# Patient Record
Sex: Female | Born: 1960 | Race: White | Hispanic: No | Marital: Married | Smoking: Current every day smoker
Health system: Southern US, Community
[De-identification: ages and names within clinical notes are randomized; demographics above are authoritative.]

## PROBLEM LIST (undated history)

## (undated) DIAGNOSIS — M2550 Pain in unspecified joint: Secondary | ICD-10-CM

## (undated) DIAGNOSIS — M797 Fibromyalgia: Secondary | ICD-10-CM

## (undated) DIAGNOSIS — R110 Nausea: Secondary | ICD-10-CM

## (undated) DIAGNOSIS — Z8669 Personal history of other diseases of the nervous system and sense organs: Secondary | ICD-10-CM

## (undated) DIAGNOSIS — I10 Essential (primary) hypertension: Secondary | ICD-10-CM

## (undated) DIAGNOSIS — R233 Spontaneous ecchymoses: Secondary | ICD-10-CM

## (undated) DIAGNOSIS — Z860100 Personal history of colon polyps, unspecified: Secondary | ICD-10-CM

## (undated) DIAGNOSIS — K5900 Constipation, unspecified: Secondary | ICD-10-CM

## (undated) DIAGNOSIS — M1990 Unspecified osteoarthritis, unspecified site: Secondary | ICD-10-CM

## (undated) DIAGNOSIS — R0602 Shortness of breath: Secondary | ICD-10-CM

## (undated) DIAGNOSIS — R531 Weakness: Secondary | ICD-10-CM

## (undated) DIAGNOSIS — F32A Depression, unspecified: Secondary | ICD-10-CM

## (undated) DIAGNOSIS — G8929 Other chronic pain: Secondary | ICD-10-CM

## (undated) DIAGNOSIS — M2540 Effusion, unspecified joint: Secondary | ICD-10-CM

## (undated) DIAGNOSIS — F329 Major depressive disorder, single episode, unspecified: Secondary | ICD-10-CM

## (undated) DIAGNOSIS — R238 Other skin changes: Secondary | ICD-10-CM

## (undated) DIAGNOSIS — J189 Pneumonia, unspecified organism: Secondary | ICD-10-CM

## (undated) DIAGNOSIS — I209 Angina pectoris, unspecified: Secondary | ICD-10-CM

## (undated) DIAGNOSIS — Z87440 Personal history of urinary (tract) infections: Secondary | ICD-10-CM

## (undated) DIAGNOSIS — J449 Chronic obstructive pulmonary disease, unspecified: Secondary | ICD-10-CM

## (undated) DIAGNOSIS — R200 Anesthesia of skin: Secondary | ICD-10-CM

## (undated) DIAGNOSIS — G930 Cerebral cysts: Secondary | ICD-10-CM

## (undated) DIAGNOSIS — M549 Dorsalgia, unspecified: Secondary | ICD-10-CM

## (undated) DIAGNOSIS — E039 Hypothyroidism, unspecified: Secondary | ICD-10-CM

## (undated) DIAGNOSIS — E785 Hyperlipidemia, unspecified: Secondary | ICD-10-CM

## (undated) DIAGNOSIS — K219 Gastro-esophageal reflux disease without esophagitis: Secondary | ICD-10-CM

## (undated) DIAGNOSIS — G4700 Insomnia, unspecified: Secondary | ICD-10-CM

## (undated) DIAGNOSIS — R202 Paresthesia of skin: Secondary | ICD-10-CM

## (undated) DIAGNOSIS — Z8709 Personal history of other diseases of the respiratory system: Secondary | ICD-10-CM

## (undated) HISTORY — PX: SHOULDER SURGERY: SHX246

## (undated) HISTORY — PX: COLONOSCOPY: SHX174

## (undated) HISTORY — PX: TUBAL LIGATION: SHX77

## (undated) HISTORY — PX: CARPAL TUNNEL RELEASE: SHX101

## (undated) HISTORY — PX: TONSILLECTOMY: SUR1361

## (undated) HISTORY — PX: ESOPHAGOGASTRODUODENOSCOPY: SHX1529

## (undated) HISTORY — PX: BREAST SURGERY: SHX581

## (undated) HISTORY — PX: CHOLECYSTECTOMY: SHX55

---

## 1994-08-02 HISTORY — PX: ABDOMINAL HYSTERECTOMY: SHX81

## 2008-05-21 ENCOUNTER — Ambulatory Visit (HOSPITAL_BASED_OUTPATIENT_CLINIC_OR_DEPARTMENT_OTHER): Admission: RE | Admit: 2008-05-21 | Discharge: 2008-05-21 | Payer: Self-pay | Admitting: Orthopaedic Surgery

## 2010-03-20 ENCOUNTER — Ambulatory Visit (HOSPITAL_COMMUNITY): Admission: RE | Admit: 2010-03-20 | Discharge: 2010-03-20 | Payer: Self-pay | Admitting: Cardiovascular Disease

## 2010-08-02 HISTORY — PX: CARDIAC CATHETERIZATION: SHX172

## 2010-10-16 LAB — CBC
HCT: 39.3 % (ref 36.0–46.0)
MCH: 32.3 pg (ref 26.0–34.0)
MCHC: 35.4 g/dL (ref 30.0–36.0)
MCV: 91.2 fL (ref 78.0–100.0)
Platelets: 265 10*3/uL (ref 150–400)
RDW: 13.7 % (ref 11.5–15.5)
WBC: 9.1 10*3/uL (ref 4.0–10.5)

## 2010-10-16 LAB — URINALYSIS, ROUTINE W REFLEX MICROSCOPIC
Bilirubin Urine: NEGATIVE
Glucose, UA: NEGATIVE mg/dL
Hgb urine dipstick: NEGATIVE
Ketones, ur: NEGATIVE mg/dL
Specific Gravity, Urine: 1.017 (ref 1.005–1.030)
Urobilinogen, UA: 1 mg/dL (ref 0.0–1.0)

## 2010-10-16 LAB — PROTIME-INR
INR: 0.89 (ref 0.00–1.49)
Prothrombin Time: 12.3 seconds (ref 11.6–15.2)

## 2010-10-16 LAB — BASIC METABOLIC PANEL
CO2: 27 mEq/L (ref 19–32)
GFR calc non Af Amer: >60 mL/min (ref >60)

## 2010-12-15 NOTE — Op Note (Signed)
Savannah Gardner, Savannah Gardner            ACCOUNT NO.:  [id number]   MEDICAL RECORD NO.:  [id number]          PATIENT TYPE:  AMB   LOCATION:  DSC                          FACILITY:  [facility]   PHYSICIAN:  Lubertha Basque. Dalldorf, M.D.DATE OF BIRTH:  09/19/1960   DATE OF PROCEDURE:  05/21/2008  DATE OF DISCHARGE:                               OPERATIVE REPORT   PREOPERATIVE DIAGNOSES:  1. Right shoulder biceps tendinopathy.  2. Right wrist de Quervain tenosynovitis.   POSTOPERATIVE DIAGNOSES:  1. Right shoulder biceps tendinopathy.  2. Right wrist de Quervain tenosynovitis.   PROCEDURE:  1. Right shoulder arthroscopic debridement and tenotomy.  2. Right shoulder open biceps tenodesis.  3. Right wrist de Quervain's release.   ANESTHESIA:  General and block.   ATTENDING SURGEON:  Lubertha Basque. Jerl Santos, MD   ASSISTANT:  Lindwood Qua, PA   INDICATIONS FOR PROCEDURE:  The patient is a 50 year old woman with a  long history of right shoulder difficulty.  She is many years out from  arthroscopy elsewhere of the shoulder where an acromioplasty and AC  resection were done.  She has persisted with anterior pain despite  several injections.  By an old MRI scan, she has some biceps  tendinopathy.  Her pain persists in the bicipital groove and limits her  ability to rest and use her arm.  At this point, she is offered a repeat  arthroscopy with probable open biceps tenodesis.  She also has had  several injections about her first dorsal compartment of the right wrist  consistent with de Quervain's.  She received transient relief with each  and at this point, she is offered a first dorsal compartment release  under the same sitting.  Informed operative consent was obtained after  discussion of possible complications including reaction to anesthesia,  infection, and neurovascular injury.   SUMMARY, FINDINGS, AND PROCEDURE:  Under general anesthesia and a block,  a right shoulder arthroscopy was initially  performed.  The glenohumeral  joint showed no degenerative changes and the rotator cuff appeared  benign from below.  She did have a type 1 SLAP addressed with a  debridement.  Her biceps tendon appeared quite degenerative and swollen  at the attachment site on the superior labrum.  There was also a type 2  component to this SLAP labral issue and I performed a debridement under  the labrum to bleeding bone.  I then performed a tenotomy with basket  and shaver.  The shoulder was irrigated followed by removal of  arthroscopic equipment.  We then made a small anterolateral incision  with dissection down through a split in the deltoid to the bicipital  groove.  I performed a tenodesis of the tendon in this area to bleeding  bed of bone with some sutures.  We then made a small incision on the  dorsoradial aspect of the wrist and performed a first dorsal compartment  release visualizing all 3 tendons and the nerve which was retracted out  of harm's way.   DESCRIPTION OF PROCEDURE:  The patient was taken to the operating suite  where general anesthetic was applied without  difficulty.  She was also  given a block in the Preanesthesia Area.  She was positioned in beach-  chair position and prepped and draped in the normal sterile fashion.  After administration of IV Kefzol, an arthroscopy of the right shoulder  was performed through total of 2 portals.  Findings were as noted above.  Procedure consisted of the debridement of the degenerative tendon and  the superior aspect of the glenoid under her slap.  The tenotomy was  done with basket and shaver.  I then made the anterolateral incision  with dissection down to the bicipital groove.  The tendon was pulled  down into this area.  I created a bleeding bed of bone in the bicipital  groove and then sutured the tendon with #1 Vicryl to stay directly in  this area.  We sutured the tendon to surrounding thick tissues.  I cut  the excess tendon sharply  with a knife.  This wound was irrigated  followed by reapproximation of the deltoid split with 0 Vicryl.  Subcutaneous tissues were reapproximated with 2-0 undyed Vicryl and skin  was closed with nylon.  We also reapproximated the arthroscopic portals  with simple sutures of nylon.  We then made a small dorsoradial incision  just proximal to the radial styloid at the same side of the wrist.  Dissection was carried down to the first dorsal compartment.  I  retracted the radial sensory nerve out of harm's way and this was well  visualized through the course of the incision.  I released the first  dorsal compartment and saw 3 slips of tendon.  I did not seem to be  under terrible amount of pressure.  The wound was irrigated followed by  reapproximation of the skin with nylon.  Adaptic was applied here  followed by dry gauze and loose Ace wrap.  To the shoulder, we applied  Adaptic and dry gauze with tape.  Estimated blood loss and  intraoperative fluids could be obtained from anesthesia records.   DISPOSITION:  The patient was extubated in the operating room and taken  to the recovery room in stable addition.  She was to go home the same-  day and follow up in the office closely.  I will contact her by phone  tonight.  Lindwood Qua assisted throughout this case, especially the  biceps tenodesis portion and was invaluable to its completion in that he  helped maintain position and retract while I performed the procedure.      Lubertha Basque Jerl Santos, M.D.  Electronically Signed     PGD/MEDQ  D:  05/21/2008  T:  05/21/2008  Job:  [id number]

## 2011-05-04 LAB — BASIC METABOLIC PANEL
BUN: 8
Calcium: 9.4
Chloride: 105
GFR calc Af Amer: >60
GFR calc non Af Amer: >60
Glucose, Bld: 97
Potassium: 4.8
Sodium: 139

## 2011-05-04 LAB — POCT HEMOGLOBIN-HEMACUE: Hemoglobin: 14.9

## 2011-07-02 ENCOUNTER — Encounter (HOSPITAL_COMMUNITY): Payer: Self-pay | Admitting: Pharmacy Technician

## 2011-07-09 ENCOUNTER — Other Ambulatory Visit: Payer: Self-pay | Admitting: Cardiovascular Disease

## 2011-07-16 ENCOUNTER — Other Ambulatory Visit: Payer: Self-pay

## 2011-07-16 ENCOUNTER — Encounter (HOSPITAL_COMMUNITY): Payer: Self-pay | Admitting: Physician Assistant

## 2011-07-16 ENCOUNTER — Ambulatory Visit (HOSPITAL_COMMUNITY)
Admission: RE | Admit: 2011-07-16 | Discharge: 2011-07-16 | Disposition: A | Discharge status: Home / Self Care | Payer: Medicare Other | Source: Ambulatory Visit | Attending: Cardiovascular Disease | Admitting: Cardiovascular Disease

## 2011-07-16 ENCOUNTER — Encounter (HOSPITAL_COMMUNITY)
Admission: RE | Disposition: A | Discharge status: Home / Self Care | Payer: Self-pay | Source: Ambulatory Visit | Attending: Cardiovascular Disease

## 2011-07-16 DIAGNOSIS — M797 Fibromyalgia: Secondary | ICD-10-CM

## 2011-07-16 DIAGNOSIS — F17200 Nicotine dependence, unspecified, uncomplicated: Secondary | ICD-10-CM | POA: Insufficient documentation

## 2011-07-16 DIAGNOSIS — E785 Hyperlipidemia, unspecified: Secondary | ICD-10-CM

## 2011-07-16 DIAGNOSIS — I200 Unstable angina: Secondary | ICD-10-CM

## 2011-07-16 DIAGNOSIS — Z720 Tobacco use: Secondary | ICD-10-CM

## 2011-07-16 DIAGNOSIS — R079 Chest pain, unspecified: Secondary | ICD-10-CM | POA: Insufficient documentation

## 2011-07-16 DIAGNOSIS — I10 Essential (primary) hypertension: Secondary | ICD-10-CM

## 2011-07-16 HISTORY — DX: Hyperlipidemia, unspecified: E78.5

## 2011-07-16 HISTORY — DX: Fibromyalgia: M79.7

## 2011-07-16 HISTORY — DX: Essential (primary) hypertension: I10

## 2011-07-16 HISTORY — DX: Chronic obstructive pulmonary disease, unspecified: J44.9

## 2011-07-16 HISTORY — PX: LEFT HEART CATHETERIZATION WITH CORONARY ANGIOGRAM: SHX5451

## 2011-07-16 LAB — CBC
HCT: 39 % (ref 36.0–46.0)
MCH: 31.6 pg (ref 26.0–34.0)
MCV: 92 fL (ref 78.0–100.0)
Platelets: 270 10*3/uL (ref 150–400)
RBC: 4.24 MIL/uL (ref 3.87–5.11)
WBC: 9.2 10*3/uL (ref 4.0–10.5)

## 2011-07-16 LAB — BASIC METABOLIC PANEL
CO2: 26 mEq/L (ref 19–32)
Calcium: 9.1 mg/dL (ref 8.4–10.5)
Chloride: 106 mEq/L (ref 96–112)
Creatinine, Ser: 0.77 mg/dL (ref 0.50–1.10)
Glucose, Bld: 113 mg/dL — ABNORMAL HIGH (ref 70–99)
Sodium: 139 mEq/L (ref 135–145)

## 2011-07-16 SURGERY — LEFT HEART CATHETERIZATION WITH CORONARY ANGIOGRAM
Anesthesia: LOCAL

## 2011-07-16 MED ORDER — ONDANSETRON HCL 4 MG/2ML IJ SOLN: 4.0000 mg | Freq: Four times a day (QID) | INTRAMUSCULAR | Status: DC | PRN

## 2011-07-16 MED ORDER — SODIUM CHLORIDE 0.9 % IV SOLN: INTRAVENOUS | Status: DC

## 2011-07-16 MED ORDER — MIDAZOLAM HCL 2 MG/2ML IJ SOLN
INTRAMUSCULAR | Status: AC
  Filled 2011-07-16: qty 2

## 2011-07-16 MED ORDER — SODIUM CHLORIDE 0.9 % IV SOLN
INTRAVENOUS | Status: DC
  Administered 2011-07-16: 11:00:00 via INTRAVENOUS

## 2011-07-16 MED ORDER — FENTANYL CITRATE 0.05 MG/ML IJ SOLN
INTRAMUSCULAR | Status: AC
  Filled 2011-07-16: qty 2

## 2011-07-16 MED ORDER — SODIUM CHLORIDE 0.9 % IJ SOLN: 3.0000 mL | INTRAMUSCULAR | Status: DC | PRN

## 2011-07-16 MED ORDER — ACETAMINOPHEN 325 MG PO TABS: 650.0000 mg | ORAL_TABLET | ORAL | Status: DC | PRN

## 2011-07-16 MED ORDER — HEPARIN (PORCINE) IN NACL 2-0.9 UNIT/ML-% IJ SOLN
INTRAMUSCULAR | Status: AC
  Filled 2011-07-16: qty 2000

## 2011-07-16 MED ORDER — DIAZEPAM 5 MG PO TABS
5.0000 mg | ORAL_TABLET | ORAL | Status: AC
  Administered 2011-07-16: 5 mg via ORAL
  Filled 2011-07-16: qty 1

## 2011-07-16 MED ORDER — ASPIRIN 81 MG PO CHEW
CHEWABLE_TABLET | ORAL | Status: AC
  Filled 2011-07-16: qty 4

## 2011-07-16 MED ORDER — LIDOCAINE HCL (PF) 1 % IJ SOLN
INTRAMUSCULAR | Status: AC
  Filled 2011-07-16: qty 30

## 2011-07-16 MED ORDER — NITROGLYCERIN 0.2 MG/ML ON CALL CATH LAB
INTRAVENOUS | Status: AC
  Filled 2011-07-16: qty 1

## 2011-07-16 NOTE — Progress Notes (Signed)
IV team unable to come for second IV site, Cath lab notified Selena Batten

## 2011-07-16 NOTE — H&P (Signed)
  H & P will be scanned in.  Pt was reexamined and existing H & P reviewed. No changes found.  Runell Gess, MD [facility] 07/16/2011 12:04 PM

## 2011-07-16 NOTE — H&P (Signed)
Savannah Gardner is an 50 y.o. female.   Chief Complaint: Chest Pain  HPI: patient is a 50 year old obese Caucasian female who is a patient of Dr. Allyson Sabal and Roxanne Mins.  She has a history of hypertension, hyperlipidemia, tobacco abuse, fibromyalgia, COPD. She had a left heart catheterization in August of 2011 which showed a normal  coronary anatomy.  The overall LVEF was estimated at greater than 60% without focal wall motion abnormalities.  Patient had a recent normal nuclear stress test on 06/22/2011.  Patient was recently treated with antibiotics for bronchitis.  She continues to have chest pain for the last year which radiates to her jaw with diaphoresis, shortness of breath, palpitations, nausea and weakness, two-pillow orthopnea and paroxysmal nocturnal dyspnea.  Complains of occasional lower extremity edema on the right.  She also reports lower extremity pain with ambulation and relieved with rest. She presents for left heart catheterization.  Past Medical History  Diagnosis Date  . Hypertension   . Hyperlipemia   . Fibromyalgia   . COPD (chronic obstructive pulmonary disease)     Past Surgical History  Procedure Date  . Tonsillectomy   . Breast surgery   . Cholecystectomy   . Abdominal hysterectomy     Family History  Problem Relation Age of Onset  . Heart failure Mother    Social History:  reports that she has been smoking.  She does not have any smokeless tobacco history on file. She reports that she does not drink alcohol or use illicit drugs.  Allergies:  Allergies  Allergen Reactions  . Sulfa Antibiotics Other (See Comments)    Threw up  . Penicillins Rash    Medications Prior to Admission  Medication Dose Route Frequency Provider Last Rate Last Dose  . 0.9 %  sodium chloride infusion   Intravenous Continuous Runell Gess, MD      . diazepam (VALIUM) tablet 5 mg  5 mg Oral On Call Runell Gess, MD      . sodium chloride 0.9 % injection 3 mL  3 mL  Intravenous PRN Runell Gess, MD       Medications Prior to Admission  Medication Sig Dispense Refill  . albuterol (PROVENTIL HFA;VENTOLIN HFA) 108 (90 BASE) MCG/ACT inhaler Inhale 2 puffs into the lungs 2 (two) times daily.        Marland Kitchen ALPRAZolam (XANAX) 0.5 MG tablet Take 0.5 mg by mouth 3 (three) times daily as needed. For anxiety       . budesonide-formoterol (SYMBICORT) 160-4.5 MCG/ACT inhaler Inhale 2 puffs into the lungs 2 (two) times daily.        . celecoxib (CELEBREX) 200 MG capsule Take 200 mg by mouth daily.        . Choline Fenofibrate (TRILIPIX) 135 MG capsule Take 135 mg by mouth daily.        . cyclobenzaprine (FLEXERIL) 10 MG tablet Take 10 mg by mouth 3 (three) times daily.        Marland Kitchen dexlansoprazole (DEXILANT) 60 MG capsule Take 60 mg by mouth 2 (two) times daily.        . diclofenac sodium (VOLTAREN) 1 % GEL Apply 1 application topically 2 (two) times daily.        Marland Kitchen estradiol (ESTRACE) 2 MG tablet Take 2 mg by mouth daily.        Marland Kitchen gabapentin (NEURONTIN) 800 MG tablet Take 800 mg by mouth 4 (four) times daily.        . hydrochlorothiazide (  HYDRODIURIL) 25 MG tablet Take 25 mg by mouth daily.        Marland Kitchen lidocaine (LIDODERM) 5 % Place 1 patch onto the skin daily as needed. Remove & Discard patch within 12 hours or as directed by MD for pain       . lisinopril (PRINIVIL,ZESTRIL) 40 MG tablet Take 40 mg by mouth daily.        Marland Kitchen lubiprostone (AMITIZA) 24 MCG capsule Take 24 mcg by mouth 2 (two) times daily with a meal.        . methadone (DOLOPHINE) 10 MG tablet Take 10 mg by mouth 4 (four) times daily.        . metoCLOPramide (REGLAN) 10 MG tablet Take 10 mg by mouth 2 (two) times daily.        . nebivolol (BYSTOLIC) 5 MG tablet Take 5 mg by mouth daily.        Marland Kitchen omeprazole (PRILOSEC) 20 MG capsule Take 40 mg by mouth daily.        Marland Kitchen oxyCODONE (OXYCONTIN) 10 MG 12 hr tablet Take 10 mg by mouth 4 (four) times daily.        . ranolazine (RANEXA) 500 MG 12 hr tablet Take 500 mg by  mouth 2 (two) times daily.        . simvastatin (ZOCOR) 40 MG tablet Take 40 mg by mouth at bedtime.        Marland Kitchen tiotropium (SPIRIVA) 18 MCG inhalation capsule Place 18 mcg into inhaler and inhale daily.        Marland Kitchen topiramate (TOPAMAX) 100 MG tablet Take 100 mg by mouth 2 (two) times daily.        . Vitamin D, Ergocalciferol, (DRISDOL) 50000 UNITS CAPS Take 50,000 Units by mouth every 7 (seven) days. Takes on mondays       . zolpidem (AMBIEN) 10 MG tablet Take 10 mg by mouth at bedtime as needed. For sleep         Results for orders placed during the hospital encounter of 07/16/11 (from the past 48 hour(s))  BASIC METABOLIC PANEL     Status: Abnormal   Collection Time   07/16/11  9:30 AM      Component Value Range Comment   Sodium 139  135 - 145 (mEq/L)    Potassium 3.8  3.5 - 5.1 (mEq/L)    Chloride 106  96 - 112 (mEq/L)    CO2 26  19 - 32 (mEq/L)    Glucose, Bld 113 (*) 70 - 99 (mg/dL)    BUN 11  6 - 23 (mg/dL)    Creatinine, Ser 1.61  0.50 - 1.10 (mg/dL)    Calcium 9.1  8.4 - 10.5 (mg/dL)    GFR calc non Af Amer >90  >90 (mL/min)    GFR calc Af Amer >90  >90 (mL/min)   CBC     Status: Normal   Collection Time   07/16/11  9:30 AM      Component Value Range Comment   WBC 9.2  4.0 - 10.5 (K/uL)    RBC 4.24  3.87 - 5.11 (MIL/uL)    Hemoglobin 13.4  12.0 - 15.0 (g/dL)    HCT 09.6  04.5 - 40.9 (%)    MCV 92.0  78.0 - 100.0 (fL)    MCH 31.6  26.0 - 34.0 (pg)    MCHC 34.4  30.0 - 36.0 (g/dL)    RDW 81.1  91.4 - 78.2 (%)    Platelets  270  150 - 400 (K/uL)   PROTIME-INR     Status: Normal   Collection Time   07/16/11  9:30 AM      Component Value Range Comment   Prothrombin Time 13.0  11.6 - 15.2 (seconds)    INR 0.96  0.00 - 1.49     No results found.  Review of Systems  Constitutional: Positive for diaphoresis.  HENT: Negative.   Eyes: Negative.   Respiratory: Positive for shortness of breath. Negative for wheezing.   Cardiovascular: Positive for chest pain, palpitations,  orthopnea, claudication, leg swelling and PND.  Gastrointestinal: Positive for nausea and constipation. Negative for vomiting, abdominal pain, diarrhea, blood in stool and melena.  Genitourinary: Negative for dysuria and hematuria.  Neurological: Positive for weakness.  Psychiatric/Behavioral: Negative.   All other systems reviewed and are negative.    Blood pressure 104/66, pulse 74, temperature 97.1 F (36.2 C), temperature source Oral, resp. rate 18, height 5\' 1"  (1.549 m), weight 70.308 kg (155 lb), SpO2 100.00%. Physical Exam  Constitutional: She is oriented to person, place, and time. No distress.       Obese   HENT:  Head: Normocephalic and atraumatic.  Eyes: EOM are normal. Pupils are equal, round, and reactive to light. No scleral icterus.  Neck: Neck supple. No JVD present.  Cardiovascular: Normal rate and regular rhythm.   Murmur heard.      Faint systolic MM RSB 2+ radials, 1+ DPs and PTs Negative carotid or femoral bruits.   Respiratory: Effort normal. She has no wheezes. She has no rales.       Decreased BS bilaterally.  GI: Soft. Bowel sounds are normal. She exhibits no distension. There is no tenderness.       Negative bruit   Musculoskeletal: She exhibits no edema.  Lymphadenopathy:    She has no cervical adenopathy.  Neurological: She is alert and oriented to person, place, and time. She exhibits normal muscle tone.  Skin: Skin is warm and dry.     Assessment/Plan Patient Active Hospital Problem List: Unstable angina (07/16/2011) Tobacco abuse (07/16/2011) Fibromyalgia (07/16/2011) HTN (hypertension) (07/16/2011) HLD (hyperlipidemia) (07/16/2011)  Plan: Due to continued to nitroglycerin responsive chest pain with radiation to the jaw patient will undergo diagnostic left heart catheterization with possible PCI.    HAGER,BRYAN W 07/16/2011, 10:32 AM   Agree with note written by [facility]  Runell Gess 07/16/2011 1:51 PM

## 2011-07-16 NOTE — Op Note (Signed)
Savannah Gardner is a 50 y.o. female    [id number] LOCATION:  FACILITY: [facility]  PHYSICIAN: Nanetta Batty, M.D. 10/17/1960   DATE OF PROCEDURE:  07/16/2011  DATE OF DISCHARGE:  [facility]  CARDIAC CATHETERIZATION     History obtained from chart review. [name] 51 year old married Caucasian female patient of [name]. She has a history of a Normal cardiac catheterization several years ago. She is positive cardiac risk factors including a 2-3 pack history of tobacco abuse with COPD. Just has a positive family history of heart disease. She had a negative functional study recently but continues to have nitrate responsive chest pain. She was referred by Arnette Felts for cardiac catheterization to rule out ischemic heart disease.   PROCEDURE DESCRIPTION: the patient was brought to the second floor  [place] cardiac catheterization laboratory in the postabsorptive state. She was premedicated with by mouth Valium and IV Versed and fentanyl. Her right groin was prepped and shaved in the usual sterile fashion. 1% Xylocaine was USED FOR LOCAL ANESTHESIA> A 5 French sheath was inserted into the right femoral artery using standard Seldinger technique. 5 French right and left Judkins diagnostic catheters were used for selective coronary angiography a 5 French pigtail catheter was used for left ventriculography. Visipaque dye was used for the entirety of the case. Retrograde aortic, ventricular pullback pressures were recorded.      HEMODYNAMICS:    AO SYSTOLIC/AO DIASTOLIC: 123/68   LV SYSTOLIC/LV DIASTOLIC: 128/15  ANGIOGRAPHIC RESULTS:   1. Left main; normal  2. LAD; normal 3. Left circumflex; normal.  4. Right coronary artery; normal   7. Left ventriculography; RAO left ventriculogram was performed using  25 mL of Visipaque dye at 12 mL/second. The overall LVEF estimated  60 %  Without wall motion  abnormalities  IMPRESSION:the patient has normal coronary arteries and normal LV function. I believe her chest pain is noncardiac. She'll be discharged later today as an outpatient and will followup with Arnette Felts who is notified of these results. She left the lab in stable condition.  Runell Gess MD, [facility] 07/16/2011 12:09 PM

## 2011-07-20 ENCOUNTER — Encounter (HOSPITAL_COMMUNITY): Payer: Self-pay

## 2012-01-02 IMAGING — CR DG CHEST 2V
2 series · 2 of 2 positions shown · non-contrast
Comparison: None.

CLINICAL DATA: Preop for cardiac catheterization, hypertension,
smoking history

CHEST - 2 VIEW

[view not recorded (1 of 2)]
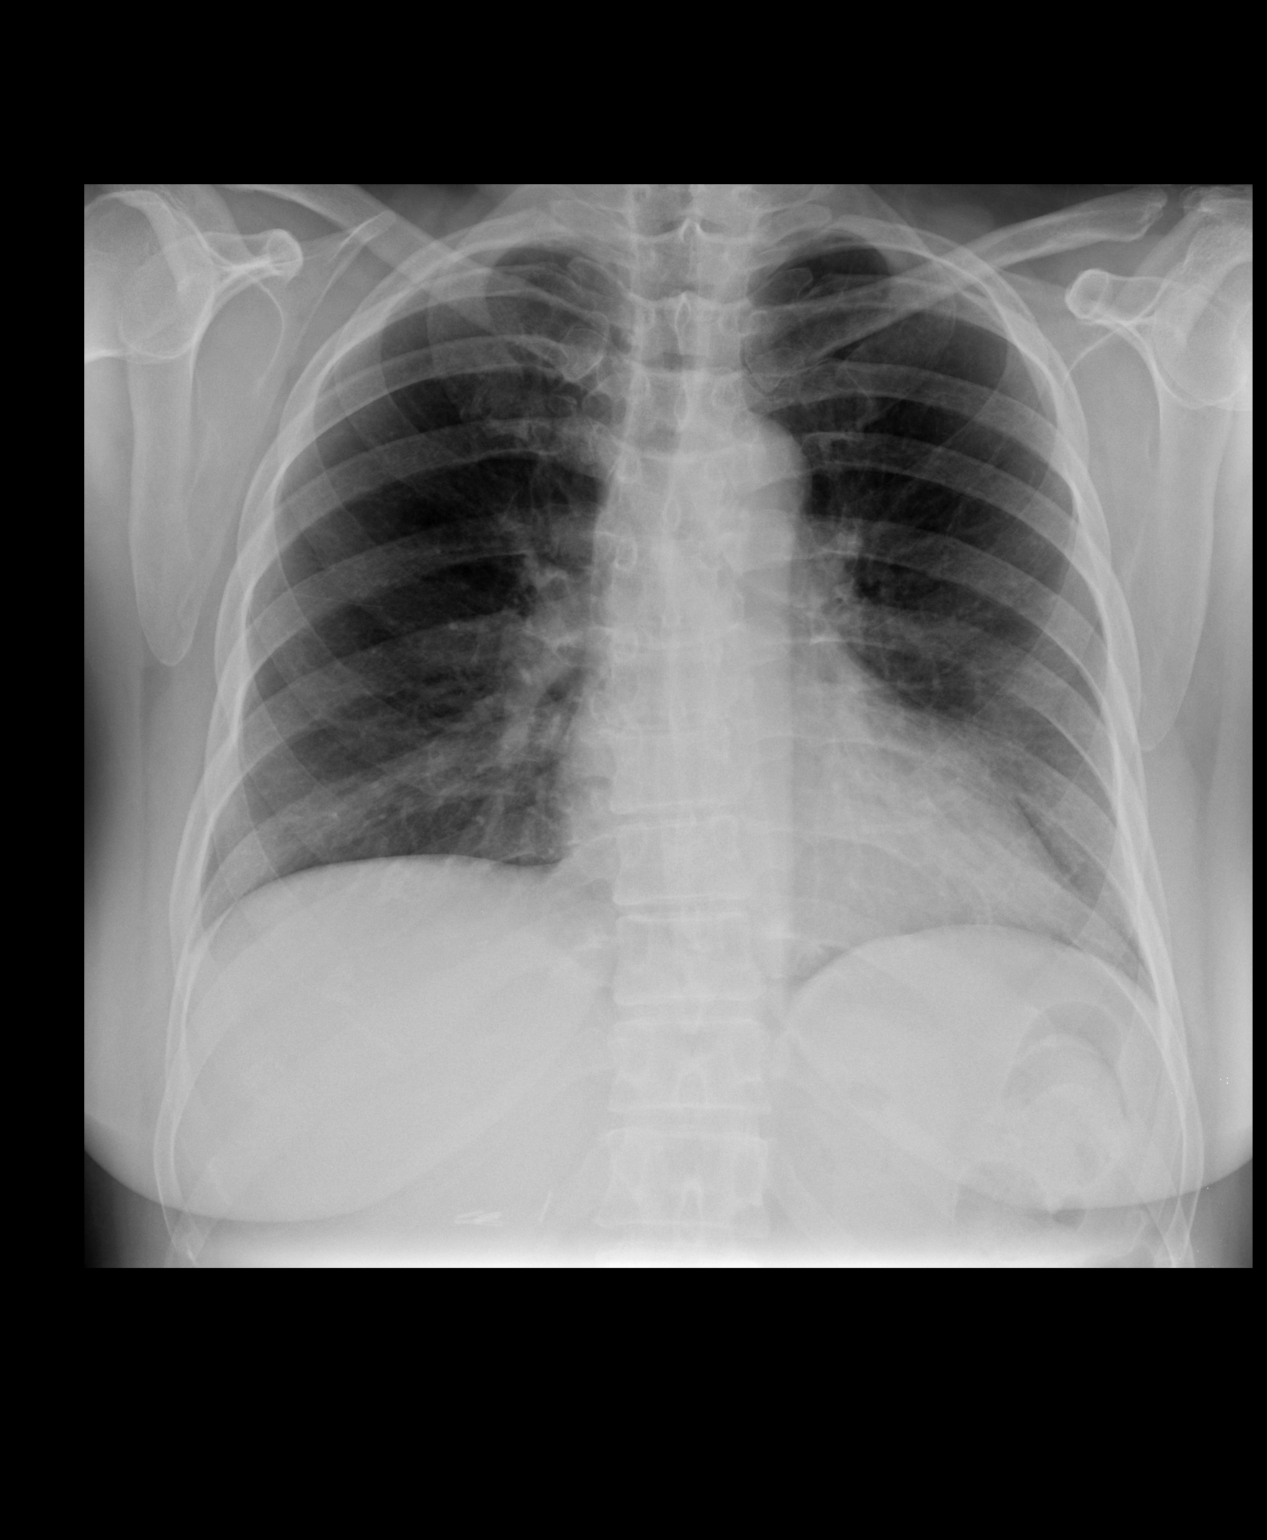

[view not recorded (2 of 2)]
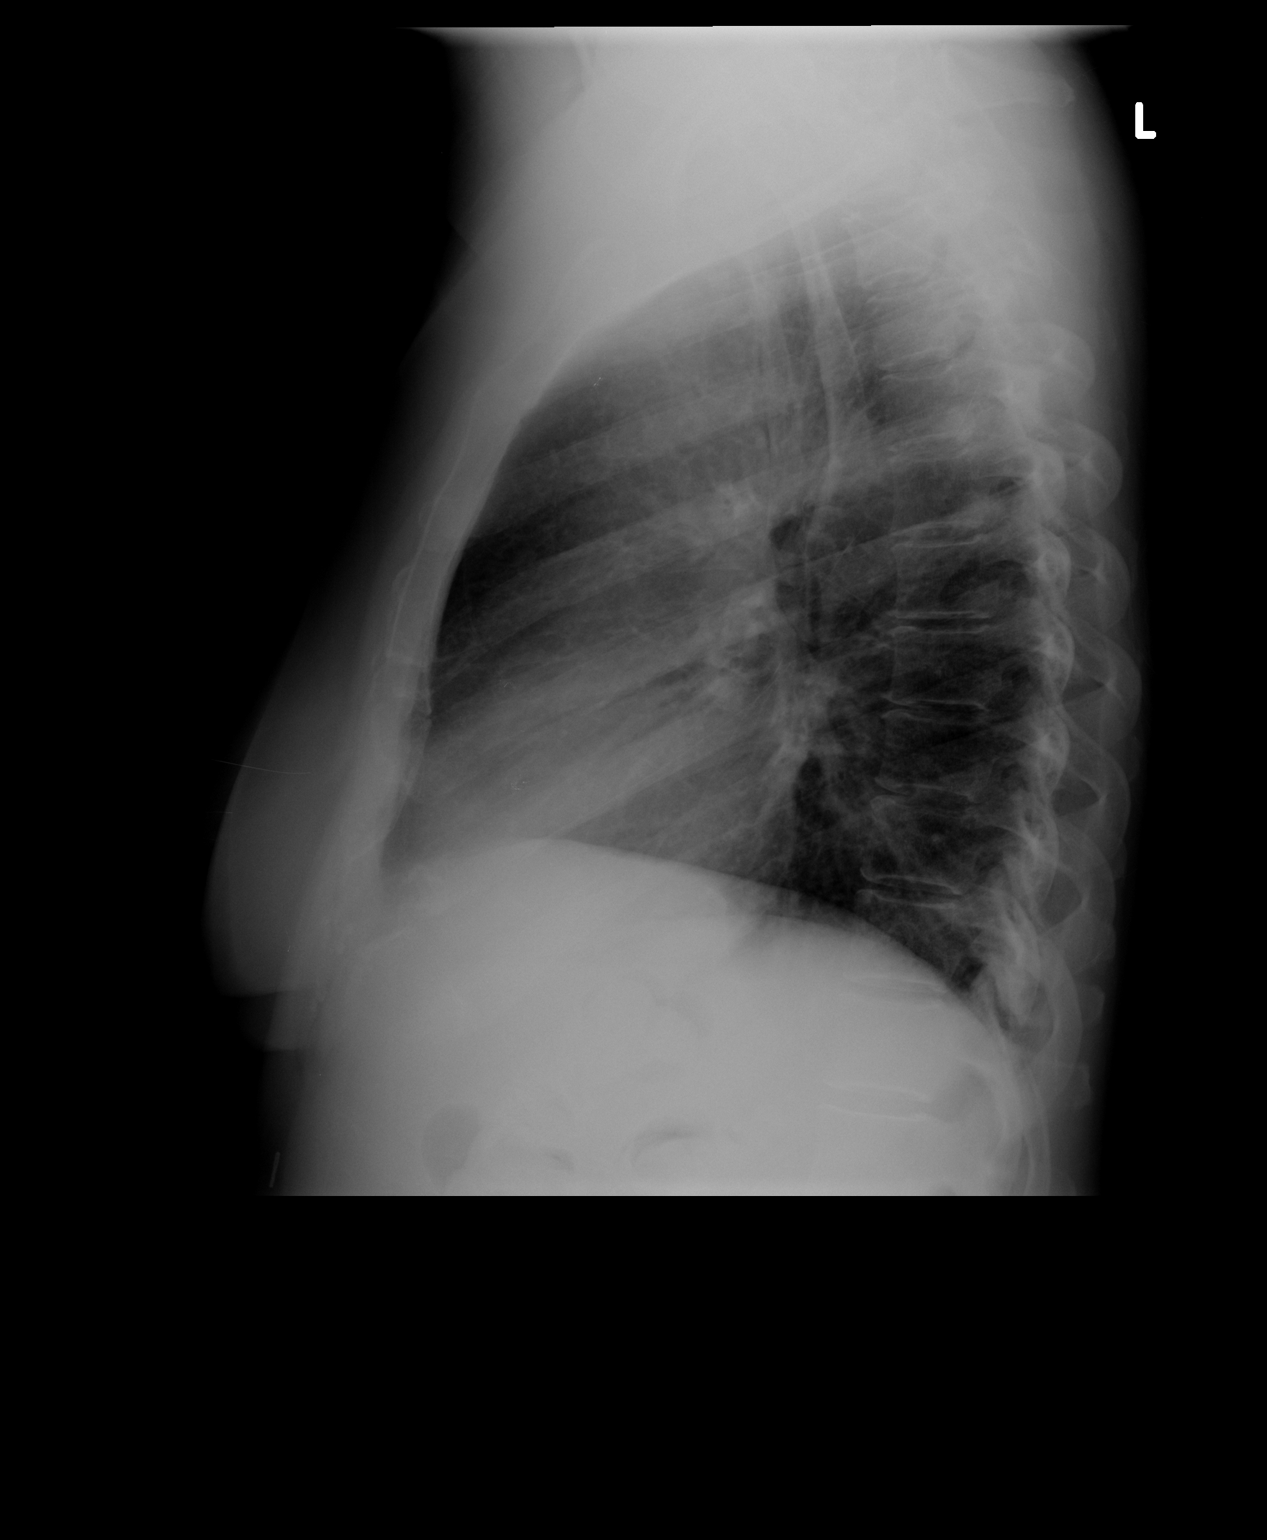

[2 of 2 positions shown; findings below may reference images not displayed]

FINDINGS: The lungs are clear.  Mild peribronchial thickening is
noted consistent with bronchitis.  The heart is within normal
limits in size.  No bony abnormality is seen.
IMPRESSION: No active lung disease.  Peribronchial thickening consistent with
bronchitis.

## 2013-04-24 ENCOUNTER — Other Ambulatory Visit: Payer: Self-pay | Admitting: Orthopedic Surgery

## 2013-04-26 ENCOUNTER — Encounter (HOSPITAL_COMMUNITY): Payer: Self-pay | Admitting: Pharmacy Technician

## 2013-04-27 ENCOUNTER — Encounter (HOSPITAL_COMMUNITY)
Admission: RE | Admit: 2013-04-27 | Discharge: 2013-04-27 | Disposition: A | Discharge status: Home / Self Care | Payer: Medicare Other | Source: Ambulatory Visit | Attending: Orthopedic Surgery | Admitting: Orthopedic Surgery

## 2013-04-27 ENCOUNTER — Encounter (HOSPITAL_COMMUNITY): Payer: Self-pay

## 2013-04-27 DIAGNOSIS — Z01818 Encounter for other preprocedural examination: Secondary | ICD-10-CM | POA: Insufficient documentation

## 2013-04-27 DIAGNOSIS — Z01812 Encounter for preprocedural laboratory examination: Secondary | ICD-10-CM | POA: Insufficient documentation

## 2013-04-27 HISTORY — DX: Insomnia, unspecified: G47.00

## 2013-04-27 HISTORY — DX: Nausea: R11.0

## 2013-04-27 HISTORY — DX: Pain in unspecified joint: M25.50

## 2013-04-27 HISTORY — DX: Major depressive disorder, single episode, unspecified: F32.9

## 2013-04-27 HISTORY — DX: Pneumonia, unspecified organism: J18.9

## 2013-04-27 HISTORY — DX: Gastro-esophageal reflux disease without esophagitis: K21.9

## 2013-04-27 HISTORY — DX: Unspecified osteoarthritis, unspecified site: M19.90

## 2013-04-27 HISTORY — DX: Personal history of urinary (tract) infections: Z87.440

## 2013-04-27 HISTORY — DX: Personal history of colonic polyps: Z86.010

## 2013-04-27 HISTORY — DX: Constipation, unspecified: K59.00

## 2013-04-27 HISTORY — DX: Other skin changes: R23.8

## 2013-04-27 HISTORY — DX: Dorsalgia, unspecified: M54.9

## 2013-04-27 HISTORY — DX: Paresthesia of skin: R20.2

## 2013-04-27 HISTORY — DX: Angina pectoris, unspecified: I20.9

## 2013-04-27 HISTORY — DX: Personal history of other diseases of the respiratory system: Z87.09

## 2013-04-27 HISTORY — DX: Spontaneous ecchymoses: R23.3

## 2013-04-27 HISTORY — DX: Hypothyroidism, unspecified: E03.9

## 2013-04-27 HISTORY — DX: Depression, unspecified: F32.A

## 2013-04-27 HISTORY — DX: Shortness of breath: R06.02

## 2013-04-27 HISTORY — DX: Anesthesia of skin: R20.0

## 2013-04-27 HISTORY — DX: Other chronic pain: G89.29

## 2013-04-27 HISTORY — DX: Effusion, unspecified joint: M25.40

## 2013-04-27 HISTORY — DX: Cerebral cysts: G93.0

## 2013-04-27 HISTORY — DX: Weakness: R53.1

## 2013-04-27 HISTORY — DX: Personal history of other diseases of the nervous system and sense organs: Z86.69

## 2013-04-27 HISTORY — DX: Personal history of colon polyps, unspecified: Z86.0100

## 2013-04-27 LAB — APTT: aPTT: 32 seconds (ref 24–37)

## 2013-04-27 LAB — COMPREHENSIVE METABOLIC PANEL
Albumin: 3.7 g/dL (ref 3.5–5.2)
BUN: 12 mg/dL (ref 6–23)
Chloride: 100 mEq/L (ref 96–112)
Creatinine, Ser: 0.63 mg/dL (ref 0.50–1.10)
GFR calc Af Amer: >90 mL/min (ref >90)
Glucose, Bld: 92 mg/dL (ref 70–99)
Potassium: 3.9 mEq/L (ref 3.5–5.1)
Total Bilirubin: 0.2 mg/dL — ABNORMAL LOW (ref 0.3–1.2)
Total Protein: 7 g/dL (ref 6.0–8.3)

## 2013-04-27 LAB — TYPE AND SCREEN: Antibody Screen: NEGATIVE

## 2013-04-27 LAB — CBC WITH DIFFERENTIAL/PLATELET
Basophils Absolute: 0 10*3/uL (ref 0.0–0.1)
Basophils Relative: 0 % (ref 0–1)
Eosinophils Absolute: 0.2 10*3/uL (ref 0.0–0.7)
HCT: 41.8 % (ref 36.0–46.0)
Hemoglobin: 15 g/dL (ref 12.0–15.0)
MCH: 32.5 pg (ref 26.0–34.0)
MCHC: 35.9 g/dL (ref 30.0–36.0)
Monocytes Absolute: 0.6 10*3/uL (ref 0.1–1.0)
Monocytes Relative: 5 % (ref 3–12)
Neutrophils Relative %: 67 % (ref 43–77)

## 2013-04-27 LAB — URINALYSIS, ROUTINE W REFLEX MICROSCOPIC
Bilirubin Urine: NEGATIVE
Ketones, ur: NEGATIVE mg/dL
Nitrite: NEGATIVE
Specific Gravity, Urine: 1.006 (ref 1.005–1.030)
Urobilinogen, UA: 0.2 mg/dL (ref 0.0–1.0)
pH: 6.5 (ref 5.0–8.0)

## 2013-04-27 LAB — SURGICAL PCR SCREEN: Staphylococcus aureus: NEGATIVE

## 2013-04-27 LAB — PROTIME-INR
INR: 0.89 (ref 0.00–1.49)
Prothrombin Time: 11.9 seconds (ref 11.6–15.2)

## 2013-04-27 MED ORDER — POVIDONE-IODINE 7.5 % EX SOLN: Freq: Once | CUTANEOUS | Status: DC

## 2013-04-27 NOTE — Progress Notes (Signed)
Anesthesia Chart Review:  Patient is a 52 year old female scheduled for C3-4, C4-5, C5-6 ACDF on 05/02/13 by Dr. Yevette Edwards.  PAT visit was earlier today. Patient did not wish to speak with me during her PAT visit.   History includes fibromyalgia, smoking, COPD, HTN, hypothyroidism, migraines, brain cyst, GERD, chronic intermittent chest pains (last two weeks ago) with normal cath in 2009 (?), 2011, and 2012 and multiple non-ischemic stress tests most recently 08/24/12, UTIs, insomnia, hysterectomy, tonsillectomy, breast and shoulder surgeries.  PCP is Carron Curie PA-C with Dr. Felix Pacini with [facility].  Cardiologist is Dr. Beverely Pace with [facility] in their [place] office. PRN cardiology follow-up was recommended at her last visit on 09/05/12.    Nuclear stress test on 08/24/12 showed no evidence of ischemia, normal LV function with EF 72%.  Cardiac cath on 07/16/11 [facility]; Dr. Nanetta Batty) showed normal LM, LAD, LCX, RCA.  Overall LVEF estimated at 60% without wall motion abnormalities. Chest pain was felt to be non-cardiac.  Preoperative labs noted.  Short Stay staff will follow-up on CXR and EKG requested from cardiology and PCP offices.  If not within the year then she will need them repeated on the day of surgery.  Patient has multiple negative cardiac work-ups for chest pain with normal cath within the past two year and non-ischemic stress test within the year.  If no progressive CV symptom or acute change in her symptomology then I would anticipate that she could proceed as planned.  Velna Ochs [facility] Short Stay Center/Anesthesiology Phone [phone] 04/27/2013 5:41 PM

## 2013-04-27 NOTE — Progress Notes (Signed)
Cardiologist is Dr.Cheek-last visit in [place] this yr at [facility] cath report in epic from 2012  Stress test and EKG along with CXR to be requested from Dr.Cheek  Denies ever having an echo  Savannah Gardner is PA with Dr.Jonathan Hensin with [employer]

## 2013-04-27 NOTE — Pre-Procedure Instructions (Signed)
Savannah Gardner  04/27/2013   Your procedure is scheduled on:  Thurs, Oct 2   Report to [facility]  2 * 3 at 11:30 AM per Dr.Dumonski  Call this number if you have problems the morning of surgery: [phone]   Remember:   Do not eat food or drink liquids after midnight.   Take these medicines the morning of surgery with A SIP OF WATER: Albuterol<Bring Your Inhaler With You>,Alprazolam(Xanax),Symbicort,Dexilant(Dexlansoprazole),Cymbalta(Duloxetine),Gabapentin(Neurontin),and Metoclopramide(Reglan),Spiriva(Tiotropium),and Pain Pill(if needed)              Stop taking your Aleve,Celebrex,Flexeril,and Diclofenac gel.No Goody's,BC's,Ibuprofen,Fish Oil,or any Herbal Medications   Do not wear jewelry, make-up or nail polish.  Do not wear lotions, powders, or perfumes. You may wear deodorant.  Do not shave 48 hours prior to surgery.   Do not bring valuables to the hospital.  [facility] is not responsible                  for any belongings or valuables.               Contacts, dentures or bridgework may not be worn into surgery.  Leave suitcase in the car. After surgery it may be brought to your room.  For patients admitted to the hospital, discharge time is determined by your                treatment team.               Patients discharged the day of surgery will not be allowed to drive  home.    Special Instructions: Shower using CHG 2 nights before surgery and the night before surgery.  If you shower the day of surgery use CHG.  Use special wash - you have one bottle of CHG for all showers.  You should use approximately 1/3 of the bottle for each shower.   Please read over the following fact sheets that you were given: Pain Booklet, Coughing and Deep Breathing, MRSA Information and Surgical Site Infection Prevention

## 2013-04-30 NOTE — Progress Notes (Signed)
Message left with Medical records to send EKG and CXR.

## 2013-05-02 MED ORDER — VANCOMYCIN HCL IN DEXTROSE 1-5 GM/200ML-% IV SOLN
1000.0000 mg | INTRAVENOUS | Status: AC
  Administered 2013-05-03: 1000 mg via INTRAVENOUS
  Filled 2013-05-02: qty 200

## 2013-05-02 NOTE — Progress Notes (Signed)
Patient called with time change. To arrive at 1030 

## 2013-05-03 ENCOUNTER — Encounter (HOSPITAL_COMMUNITY): Payer: Self-pay | Admitting: *Deleted

## 2013-05-03 ENCOUNTER — Inpatient Hospital Stay (HOSPITAL_COMMUNITY): Payer: Medicare Other

## 2013-05-03 ENCOUNTER — Encounter (HOSPITAL_COMMUNITY)
Admission: RE | Disposition: A | Discharge status: Home / Self Care | Payer: Self-pay | Source: Ambulatory Visit | Attending: Orthopedic Surgery

## 2013-05-03 ENCOUNTER — Inpatient Hospital Stay (HOSPITAL_COMMUNITY): Payer: Medicare Other | Admitting: Anesthesiology

## 2013-05-03 ENCOUNTER — Inpatient Hospital Stay (HOSPITAL_COMMUNITY)
Admission: RE | Admit: 2013-05-03 | Discharge: 2013-05-04 | DRG: 472 | Disposition: A | Discharge status: Home / Self Care | Payer: Medicare Other | Source: Ambulatory Visit | Attending: Orthopedic Surgery | Admitting: Orthopedic Surgery

## 2013-05-03 ENCOUNTER — Encounter (HOSPITAL_COMMUNITY): Payer: Self-pay | Admitting: Vascular Surgery

## 2013-05-03 DIAGNOSIS — M4712 Other spondylosis with myelopathy, cervical region: Principal | ICD-10-CM | POA: Diagnosis present

## 2013-05-03 DIAGNOSIS — F17200 Nicotine dependence, unspecified, uncomplicated: Secondary | ICD-10-CM | POA: Diagnosis present

## 2013-05-03 DIAGNOSIS — E039 Hypothyroidism, unspecified: Secondary | ICD-10-CM | POA: Diagnosis present

## 2013-05-03 DIAGNOSIS — M5000 Cervical disc disorder with myelopathy, unspecified cervical region: Secondary | ICD-10-CM | POA: Diagnosis present

## 2013-05-03 DIAGNOSIS — Z79899 Other long term (current) drug therapy: Secondary | ICD-10-CM

## 2013-05-03 DIAGNOSIS — K219 Gastro-esophageal reflux disease without esophagitis: Secondary | ICD-10-CM | POA: Diagnosis present

## 2013-05-03 DIAGNOSIS — M129 Arthropathy, unspecified: Secondary | ICD-10-CM | POA: Diagnosis present

## 2013-05-03 DIAGNOSIS — J4489 Other specified chronic obstructive pulmonary disease: Secondary | ICD-10-CM | POA: Diagnosis present

## 2013-05-03 DIAGNOSIS — F329 Major depressive disorder, single episode, unspecified: Secondary | ICD-10-CM | POA: Diagnosis present

## 2013-05-03 DIAGNOSIS — J449 Chronic obstructive pulmonary disease, unspecified: Secondary | ICD-10-CM | POA: Diagnosis present

## 2013-05-03 DIAGNOSIS — F3289 Other specified depressive episodes: Secondary | ICD-10-CM | POA: Diagnosis present

## 2013-05-03 DIAGNOSIS — E785 Hyperlipidemia, unspecified: Secondary | ICD-10-CM | POA: Diagnosis present

## 2013-05-03 DIAGNOSIS — I10 Essential (primary) hypertension: Secondary | ICD-10-CM | POA: Diagnosis present

## 2013-05-03 HISTORY — PX: ANTERIOR CERVICAL DECOMP/DISCECTOMY FUSION: SHX1161

## 2013-05-03 SURGERY — ANTERIOR CERVICAL DECOMPRESSION/DISCECTOMY FUSION 3 LEVELS
Anesthesia: General | Site: Spine Cervical | Wound class: Clean

## 2013-05-03 MED ORDER — OXYCODONE-ACETAMINOPHEN 10-325 MG PO TABS: 1.0000 | ORAL_TABLET | ORAL | Status: DC | PRN

## 2013-05-03 MED ORDER — LIDOCAINE HCL (CARDIAC) 20 MG/ML IV SOLN
INTRAVENOUS | Status: DC | PRN
  Administered 2013-05-03: 100 mg via INTRAVENOUS

## 2013-05-03 MED ORDER — BISACODYL 5 MG PO TBEC: 5.0000 mg | DELAYED_RELEASE_TABLET | Freq: Every day | ORAL | Status: DC | PRN

## 2013-05-03 MED ORDER — ESTRADIOL 2 MG PO TABS
2.0000 mg | ORAL_TABLET | Freq: Every day | ORAL | Status: DC
  Administered 2013-05-04: 2 mg via ORAL
  Filled 2013-05-03: qty 1

## 2013-05-03 MED ORDER — PHENOL 1.4 % MT LIQD
1.0000 | OROMUCOSAL | Status: DC | PRN
  Filled 2013-05-03: qty 177

## 2013-05-03 MED ORDER — METHADONE HCL 10 MG PO TABS
10.0000 mg | ORAL_TABLET | Freq: Four times a day (QID) | ORAL | Status: DC
  Administered 2013-05-03 – 2013-05-04 (×2): 10 mg via ORAL
  Filled 2013-05-03 (×2): qty 1

## 2013-05-03 MED ORDER — 0.9 % SODIUM CHLORIDE (POUR BTL) OPTIME
TOPICAL | Status: DC | PRN
  Administered 2013-05-03: 1000 mL

## 2013-05-03 MED ORDER — NEOSTIGMINE METHYLSULFATE 1 MG/ML IJ SOLN
INTRAMUSCULAR | Status: DC | PRN
  Administered 2013-05-03: 5 mg via INTRAVENOUS

## 2013-05-03 MED ORDER — HYDROCHLOROTHIAZIDE 25 MG PO TABS
25.0000 mg | ORAL_TABLET | Freq: Every day | ORAL | Status: DC
  Administered 2013-05-04: 25 mg via ORAL
  Filled 2013-05-03: qty 1

## 2013-05-03 MED ORDER — SUCCINYLCHOLINE CHLORIDE 20 MG/ML IJ SOLN
INTRAMUSCULAR | Status: DC | PRN
  Administered 2013-05-03: 100 mg via INTRAVENOUS

## 2013-05-03 MED ORDER — OXYCODONE HCL 5 MG PO TABS: 5.0000 mg | ORAL_TABLET | ORAL | Status: DC | PRN

## 2013-05-03 MED ORDER — GLYCOPYRROLATE 0.2 MG/ML IJ SOLN
INTRAMUSCULAR | Status: DC | PRN
  Administered 2013-05-03: 0.6 mg via INTRAVENOUS

## 2013-05-03 MED ORDER — SUFENTANIL CITRATE 50 MCG/ML IV SOLN
INTRAVENOUS | Status: DC | PRN
  Administered 2013-05-03: 30 ug via INTRAVENOUS
  Administered 2013-05-03: 10 ug via INTRAVENOUS
  Administered 2013-05-03: 20 ug via INTRAVENOUS
  Administered 2013-05-03: 5 ug via INTRAVENOUS

## 2013-05-03 MED ORDER — PROPOFOL INFUSION 10 MG/ML OPTIME
INTRAVENOUS | Status: DC | PRN
  Administered 2013-05-03: 50 ug/kg/min via INTRAVENOUS

## 2013-05-03 MED ORDER — SIMVASTATIN 40 MG PO TABS
40.0000 mg | ORAL_TABLET | Freq: Every day | ORAL | Status: DC
  Administered 2013-05-03: 40 mg via ORAL
  Filled 2013-05-03 (×2): qty 1

## 2013-05-03 MED ORDER — VITAMIN D (ERGOCALCIFEROL) 1.25 MG (50000 UNIT) PO CAPS: 50000.0000 [IU] | ORAL_CAPSULE | ORAL | Status: DC

## 2013-05-03 MED ORDER — GABAPENTIN 400 MG PO CAPS
800.0000 mg | ORAL_CAPSULE | Freq: Four times a day (QID) | ORAL | Status: DC
  Administered 2013-05-03 – 2013-05-04 (×2): 800 mg via ORAL
  Filled 2013-05-03 (×5): qty 2

## 2013-05-03 MED ORDER — ROCURONIUM BROMIDE 100 MG/10ML IV SOLN
INTRAVENOUS | Status: DC | PRN
  Administered 2013-05-03: 40 mg via INTRAVENOUS
  Administered 2013-05-03 (×2): 10 mg via INTRAVENOUS

## 2013-05-03 MED ORDER — TOPIRAMATE 100 MG PO TABS
100.0000 mg | ORAL_TABLET | Freq: Two times a day (BID) | ORAL | Status: DC
  Administered 2013-05-03 – 2013-05-04 (×2): 100 mg via ORAL
  Filled 2013-05-03 (×3): qty 1

## 2013-05-03 MED ORDER — HYDROMORPHONE HCL PF 1 MG/ML IJ SOLN
0.2500 mg | INTRAMUSCULAR | Status: DC | PRN
  Administered 2013-05-03 (×4): 0.5 mg via INTRAVENOUS

## 2013-05-03 MED ORDER — BUPIVACAINE-EPINEPHRINE 0.25% -1:200000 IJ SOLN
INTRAMUSCULAR | Status: DC | PRN
  Administered 2013-05-03: 6 mL

## 2013-05-03 MED ORDER — ONDANSETRON HCL 4 MG/2ML IJ SOLN: 4.0000 mg | Freq: Four times a day (QID) | INTRAMUSCULAR | Status: DC | PRN

## 2013-05-03 MED ORDER — OXYCODONE-ACETAMINOPHEN 5-325 MG PO TABS
1.0000 | ORAL_TABLET | ORAL | Status: DC | PRN
  Administered 2013-05-04: 1 via ORAL
  Filled 2013-05-03: qty 1

## 2013-05-03 MED ORDER — ONDANSETRON HCL 4 MG/2ML IJ SOLN
INTRAMUSCULAR | Status: DC | PRN
  Administered 2013-05-03: 4 mg via INTRAVENOUS

## 2013-05-03 MED ORDER — LACTATED RINGERS IV SOLN
INTRAVENOUS | Status: DC
  Administered 2013-05-03: 11:00:00 via INTRAVENOUS

## 2013-05-03 MED ORDER — ALBUTEROL SULFATE HFA 108 (90 BASE) MCG/ACT IN AERS
2.0000 | INHALATION_SPRAY | Freq: Two times a day (BID) | RESPIRATORY_TRACT | Status: DC
  Administered 2013-05-03 – 2013-05-04 (×2): 2 via RESPIRATORY_TRACT
  Filled 2013-05-03: qty 6.7

## 2013-05-03 MED ORDER — THROMBIN 20000 UNITS EX SOLR
CUTANEOUS | Status: AC
  Filled 2013-05-03: qty 20000

## 2013-05-03 MED ORDER — DULOXETINE HCL 60 MG PO CPEP
60.0000 mg | ORAL_CAPSULE | Freq: Every day | ORAL | Status: DC
  Administered 2013-05-04: 60 mg via ORAL
  Filled 2013-05-03: qty 1

## 2013-05-03 MED ORDER — BUDESONIDE-FORMOTEROL FUMARATE 160-4.5 MCG/ACT IN AERO
2.0000 | INHALATION_SPRAY | Freq: Two times a day (BID) | RESPIRATORY_TRACT | Status: DC
  Administered 2013-05-03 – 2013-05-04 (×2): 2 via RESPIRATORY_TRACT
  Filled 2013-05-03: qty 6

## 2013-05-03 MED ORDER — LORCASERIN HCL 10 MG PO TABS: 10.0000 mg | ORAL_TABLET | Freq: Two times a day (BID) | ORAL | Status: DC

## 2013-05-03 MED ORDER — OXYCODONE-ACETAMINOPHEN 5-325 MG PO TABS
ORAL_TABLET | ORAL | Status: AC
  Administered 2013-05-03: 2
  Filled 2013-05-03: qty 2

## 2013-05-03 MED ORDER — SENNOSIDES-DOCUSATE SODIUM 8.6-50 MG PO TABS: 1.0000 | ORAL_TABLET | Freq: Every evening | ORAL | Status: DC | PRN

## 2013-05-03 MED ORDER — MENTHOL 3 MG MT LOZG: 1.0000 | LOZENGE | OROMUCOSAL | Status: DC | PRN

## 2013-05-03 MED ORDER — OXYCODONE HCL 5 MG/5ML PO SOLN: 5.0000 mg | Freq: Once | ORAL | Status: DC | PRN

## 2013-05-03 MED ORDER — ALPRAZOLAM 0.5 MG PO TABS
0.5000 mg | ORAL_TABLET | Freq: Three times a day (TID) | ORAL | Status: DC | PRN
  Administered 2013-05-04: 0.5 mg via ORAL
  Filled 2013-05-03: qty 1

## 2013-05-03 MED ORDER — BUPIVACAINE-EPINEPHRINE PF 0.25-1:200000 % IJ SOLN
INTRAMUSCULAR | Status: AC
  Filled 2013-05-03: qty 30

## 2013-05-03 MED ORDER — CYCLOBENZAPRINE HCL 10 MG PO TABS
10.0000 mg | ORAL_TABLET | Freq: Two times a day (BID) | ORAL | Status: DC
  Administered 2013-05-03 – 2013-05-04 (×2): 10 mg via ORAL
  Filled 2013-05-03 (×4): qty 1

## 2013-05-03 MED ORDER — ALUM & MAG HYDROXIDE-SIMETH 200-200-20 MG/5ML PO SUSP: 30.0000 mL | Freq: Four times a day (QID) | ORAL | Status: DC | PRN

## 2013-05-03 MED ORDER — PROPOFOL 10 MG/ML IV BOLUS
INTRAVENOUS | Status: DC | PRN
  Administered 2013-05-03: 150 mg via INTRAVENOUS

## 2013-05-03 MED ORDER — HYDROMORPHONE HCL PF 1 MG/ML IJ SOLN
INTRAMUSCULAR | Status: AC
  Filled 2013-05-03: qty 1

## 2013-05-03 MED ORDER — NITROGLYCERIN 0.4 MG SL SUBL: 0.4000 mg | SUBLINGUAL_TABLET | SUBLINGUAL | Status: DC | PRN

## 2013-05-03 MED ORDER — SODIUM CHLORIDE 0.9 % IJ SOLN: 3.0000 mL | INTRAMUSCULAR | Status: DC | PRN

## 2013-05-03 MED ORDER — THROMBIN 20000 UNITS EX KIT
PACK | CUTANEOUS | Status: DC | PRN
  Administered 2013-05-03: 20000 [IU] via TOPICAL

## 2013-05-03 MED ORDER — ONDANSETRON HCL 4 MG/2ML IJ SOLN: 4.0000 mg | INTRAMUSCULAR | Status: DC | PRN

## 2013-05-03 MED ORDER — SODIUM CHLORIDE 0.9 % IV SOLN: 250.0000 mL | INTRAVENOUS | Status: DC

## 2013-05-03 MED ORDER — GABAPENTIN 800 MG PO TABS: 800.0000 mg | ORAL_TABLET | Freq: Four times a day (QID) | ORAL | Status: DC

## 2013-05-03 MED ORDER — DOCUSATE SODIUM 100 MG PO CAPS
100.0000 mg | ORAL_CAPSULE | Freq: Two times a day (BID) | ORAL | Status: DC
  Administered 2013-05-03 – 2013-05-04 (×2): 100 mg via ORAL
  Filled 2013-05-03 (×3): qty 1

## 2013-05-03 MED ORDER — METOCLOPRAMIDE HCL 10 MG PO TABS
10.0000 mg | ORAL_TABLET | Freq: Two times a day (BID) | ORAL | Status: DC
  Administered 2013-05-04: 10 mg via ORAL
  Filled 2013-05-03 (×3): qty 1

## 2013-05-03 MED ORDER — VANCOMYCIN HCL IN DEXTROSE 1-5 GM/200ML-% IV SOLN
1000.0000 mg | Freq: Two times a day (BID) | INTRAVENOUS | Status: DC
  Administered 2013-05-04: 1000 mg via INTRAVENOUS
  Filled 2013-05-03 (×2): qty 200

## 2013-05-03 MED ORDER — OXYCODONE HCL 5 MG PO TABS: 5.0000 mg | ORAL_TABLET | Freq: Once | ORAL | Status: DC | PRN

## 2013-05-03 MED ORDER — FLEET ENEMA 7-19 GM/118ML RE ENEM: 1.0000 | ENEMA | Freq: Once | RECTAL | Status: AC | PRN

## 2013-05-03 MED ORDER — SODIUM CHLORIDE 0.9 % IJ SOLN: 3.0000 mL | Freq: Two times a day (BID) | INTRAMUSCULAR | Status: DC

## 2013-05-03 MED ORDER — HEMOSTATIC AGENTS (NO CHARGE) OPTIME
TOPICAL | Status: DC | PRN
  Administered 2013-05-03: 1 via TOPICAL

## 2013-05-03 MED ORDER — LISINOPRIL 40 MG PO TABS
40.0000 mg | ORAL_TABLET | Freq: Every day | ORAL | Status: DC
  Administered 2013-05-04: 40 mg via ORAL
  Filled 2013-05-03: qty 1

## 2013-05-03 MED ORDER — PANTOPRAZOLE SODIUM 40 MG PO TBEC
40.0000 mg | DELAYED_RELEASE_TABLET | Freq: Every day | ORAL | Status: DC
  Administered 2013-05-04: 40 mg via ORAL
  Filled 2013-05-03: qty 1

## 2013-05-03 MED ORDER — TIOTROPIUM BROMIDE MONOHYDRATE 18 MCG IN CAPS
18.0000 ug | ORAL_CAPSULE | Freq: Every day | RESPIRATORY_TRACT | Status: DC
  Administered 2013-05-04: 18 ug via RESPIRATORY_TRACT
  Filled 2013-05-03: qty 5

## 2013-05-03 MED ORDER — LACTATED RINGERS IV SOLN
INTRAVENOUS | Status: DC | PRN
  Administered 2013-05-03 (×2): via INTRAVENOUS

## 2013-05-03 MED ORDER — ACETAMINOPHEN 325 MG PO TABS: 650.0000 mg | ORAL_TABLET | ORAL | Status: DC | PRN

## 2013-05-03 MED ORDER — ACETAMINOPHEN 650 MG RE SUPP: 650.0000 mg | RECTAL | Status: DC | PRN

## 2013-05-03 SURGICAL SUPPLY — 83 items
BENZOIN TINCTURE PRP APPL 2/3 (GAUZE/BANDAGES/DRESSINGS) ×2 IMPLANT
BIT DRILL NEURO 2X3.1 SFT TUCH (MISCELLANEOUS) ×1 IMPLANT
BIT DRILL SRG 14X2.2XFLT CHK (BIT) ×1 IMPLANT
BIT DRL SRG 14X2.2XFLT CHK (BIT) ×1
BLADE SURG 15 STRL LF DISP TIS (BLADE) ×1 IMPLANT
BLADE SURG 15 STRL SS (BLADE) ×1
BLADE SURG ROTATE 9660 (MISCELLANEOUS) IMPLANT
BUR MATCHSTICK NEURO 3.0 LAGG (BURR) ×2 IMPLANT
CANISTER SUCTION 2500CC (MISCELLANEOUS) ×2 IMPLANT
CARTRIDGE OIL MAESTRO DRILL (MISCELLANEOUS) ×1 IMPLANT
CLOTH BEACON ORANGE TIMEOUT ST (SAFETY) IMPLANT
CORDS BIPOLAR (ELECTRODE) ×2 IMPLANT
COVER SURGICAL LIGHT HANDLE (MISCELLANEOUS) ×2 IMPLANT
CRADLE DONUT ADULT HEAD (MISCELLANEOUS) ×2 IMPLANT
DEVICE ENDSKLTN CRVCL 5MM-0SM (Orthopedic Implant) ×1 IMPLANT
DEVICE ENDSKLTN TCERV VBR SM 6 (Orthopedic Implant) ×1 IMPLANT
DIFFUSER DRILL AIR PNEUMATIC (MISCELLANEOUS) ×2 IMPLANT
DRAIN JACKSON RD 7FR 3/32 (WOUND CARE) ×2 IMPLANT
DRAPE C-ARM 42X72 X-RAY (DRAPES) ×2 IMPLANT
DRAPE POUCH INSTRU U-SHP 10X18 (DRAPES) ×2 IMPLANT
DRAPE SURG 17X23 STRL (DRAPES) ×6 IMPLANT
DRILL BIT SKYLINE 14MM (BIT) ×1
DRILL NEURO 2X3.1 SOFT TOUCH (MISCELLANEOUS) ×2
DURAPREP 26ML APPLICATOR (WOUND CARE) ×2 IMPLANT
ELECT COATED BLADE 2.86 ST (ELECTRODE) ×2 IMPLANT
ELECT REM PT RETURN 9FT ADLT (ELECTROSURGICAL) ×2
ELECTRODE REM PT RTRN 9FT ADLT (ELECTROSURGICAL) ×1 IMPLANT
ENDOSKELETON CERVICAL 5MM-0SM (Orthopedic Implant) ×2 IMPLANT
ENDOSKELETON T CERV VBR SM 6MM (Orthopedic Implant) ×2 IMPLANT
EVACUATOR SILICONE 100CC (DRAIN) ×2 IMPLANT
GAUZE SPONGE 4X4 16PLY XRAY LF (GAUZE/BANDAGES/DRESSINGS) ×2 IMPLANT
GLOVE BIO SURGEON STRL SZ7 (GLOVE) ×2 IMPLANT
GLOVE BIO SURGEON STRL SZ8 (GLOVE) ×2 IMPLANT
GLOVE BIOGEL PI IND STRL 7.0 (GLOVE) ×3 IMPLANT
GLOVE BIOGEL PI IND STRL 8 (GLOVE) ×2 IMPLANT
GLOVE BIOGEL PI INDICATOR 7.0 (GLOVE) ×3
GLOVE BIOGEL PI INDICATOR 8 (GLOVE) ×2
GLOVE ECLIPSE 6.5 STRL STRAW (GLOVE) ×2 IMPLANT
GLOVE SURG SS PI 8.0 STRL IVOR (GLOVE) ×2 IMPLANT
GOWN STRL NON-REIN LRG LVL3 (GOWN DISPOSABLE) ×4 IMPLANT
GOWN STRL REIN XL XLG (GOWN DISPOSABLE) ×6 IMPLANT
INTERLOCK LRDTC CRVCL VBR 7MM (Bone Implant) ×1 IMPLANT
IV CATH 14GX2 1/4 (CATHETERS) ×2 IMPLANT
KIT BASIN OR (CUSTOM PROCEDURE TRAY) ×2 IMPLANT
KIT ROOM TURNOVER OR (KITS) ×2 IMPLANT
LORDOTIC CERVICAL VBR 7MM SM (Bone Implant) ×2 IMPLANT
MANIFOLD NEPTUNE II (INSTRUMENTS) IMPLANT
NEEDLE 27GAX1X1/2 (NEEDLE) ×2 IMPLANT
NEEDLE SPNL 20GX3.5 QUINCKE YW (NEEDLE) ×2 IMPLANT
NS IRRIG 1000ML POUR BTL (IV SOLUTION) ×2 IMPLANT
OIL CARTRIDGE MAESTRO DRILL (MISCELLANEOUS) ×2
PACK ORTHO CERVICAL (CUSTOM PROCEDURE TRAY) ×2 IMPLANT
PAD ARMBOARD 7.5X6 YLW CONV (MISCELLANEOUS) ×4 IMPLANT
PATTIES SURGICAL .5 X.5 (GAUZE/BANDAGES/DRESSINGS) IMPLANT
PATTIES SURGICAL .5 X1 (DISPOSABLE) ×2 IMPLANT
PIN DISTRACTION 14 (PIN) ×4 IMPLANT
PIN TEMP SKYLINE THREADED (PIN) ×2 IMPLANT
PLATE SKYLINE 3LVL 45MM CERV (Plate) ×2 IMPLANT
PUTTY BONE DBX 5CC MIX (Putty) ×2 IMPLANT
SCREW SKYLINE VAR OS 14MM (Screw) ×2 IMPLANT
SCREW VAR SELF TAP SKYLINE 14M (Screw) ×14 IMPLANT
SPONGE GAUZE 4X4 12PLY (GAUZE/BANDAGES/DRESSINGS) ×2 IMPLANT
SPONGE INTESTINAL PEANUT (DISPOSABLE) ×2 IMPLANT
SPONGE SURGIFOAM ABS GEL 100 (HEMOSTASIS) ×2 IMPLANT
STRIP CLOSURE SKIN 1/2X4 (GAUZE/BANDAGES/DRESSINGS) ×2 IMPLANT
SURGIFLO TRUKIT (HEMOSTASIS) IMPLANT
SUT MNCRL AB 4-0 PS2 18 (SUTURE) ×2 IMPLANT
SUT SILK 2 0 (SUTURE) ×1
SUT SILK 2-0 18XBRD TIE 12 (SUTURE) ×1 IMPLANT
SUT SILK 4 0 (SUTURE) ×1
SUT SILK 4-0 18XBRD TIE 12 (SUTURE) ×1 IMPLANT
SUT VIC AB 2-0 CT2 18 VCP726D (SUTURE) ×2 IMPLANT
SYR 30ML SLIP (SYRINGE) ×2 IMPLANT
SYR BULB IRRIGATION 50ML (SYRINGE) ×2 IMPLANT
SYR CONTROL 10ML LL (SYRINGE) ×2 IMPLANT
TAPE CLOTH 4X10 WHT NS (GAUZE/BANDAGES/DRESSINGS) IMPLANT
TAPE UMBILICAL COTTON 1/8X30 (MISCELLANEOUS) ×2 IMPLANT
TOWEL OR 17X24 6PK STRL BLUE (TOWEL DISPOSABLE) ×4 IMPLANT
TOWEL OR 17X26 10 PK STRL BLUE (TOWEL DISPOSABLE) ×2 IMPLANT
TRAY FOLEY BAG SILVER LF 14FR (CATHETERS) ×2 IMPLANT
TRAY FOLEY CATH 16FRSI W/METER (SET/KITS/TRAYS/PACK) IMPLANT
WATER STERILE IRR 1000ML POUR (IV SOLUTION) IMPLANT
YANKAUER SUCT BULB TIP NO VENT (SUCTIONS) ×2 IMPLANT

## 2013-05-03 NOTE — Transfer of Care (Signed)
Immediate Anesthesia Transfer of Care Note  Patient: Savannah Gardner  Procedure(s) Performed: Procedure(s) with comments: ANTERIOR CERVICAL DECOMPRESSION/DISCECTOMY FUSION 3 LEVELS (N/A) - Anterior cervical decompression fusion, cervical 3-4, cervical 4-5, cervical 5-6 with instrumentation and allograft.  Patient Location: PACU  Anesthesia Type:General  Level of Consciousness: awake, sedated and responds to stimulation  Airway & Oxygen Therapy: Patient Spontanous Breathing and Patient connected to face mask oxygen  Post-op Assessment: Report given to PACU RN, Post -op Vital signs reviewed and stable and Patient moving all extremities  Post vital signs: Reviewed and stable  Complications: No apparent anesthesia complications

## 2013-05-03 NOTE — Progress Notes (Signed)
ANTIBIOTIC CONSULT NOTE - INITIAL  Pharmacy Consult for vancomycin Indication: surgical prophylaxis  Allergies  Allergen Reactions  . Sulfa Antibiotics Nausea And Vomiting  . Penicillins Rash    Patient Measurements: Wt= 75.4kg  Vital Signs: Temp: 98.4 F (36.9 C) (10/02 1935) Temp src: Oral (10/02 1023) BP: 142/70 mmHg (10/02 1935) Pulse Rate: 102 (10/02 1935) Intake/Output from previous day:   Intake/Output from this shift:    Labs: No results found for this basename: WBC, HGB, PLT, LABCREA, CREATININE,  in the last 72 hours The CrCl is unknown because both a height and weight (above a minimum accepted value) are required for this calculation. No results found for this basename: VANCOTROUGH, VANCOPEAK, VANCORANDOM, GENTTROUGH, GENTPEAK, GENTRANDOM, TOBRATROUGH, TOBRAPEAK, TOBRARND, AMIKACINPEAK, AMIKACINTROU, AMIKACIN,  in the last 72 hours    Medical History: Past Medical History  Diagnosis Date  . Hyperlipemia     takes Zocor daily  . Fibromyalgia   . COPD (chronic obstructive pulmonary disease)     Albuterol,Symbicort,and Spiriva daily  . Depression     takes Cymbalta daily  . Hypothyroidism     takes Xanax prn  . Hypertension     takes HCTZ and Lisinopril daily  . Shortness of breath     with exertion  . Pneumonia     hx of;last time a yr ago  . History of bronchitis     last time couple of months ago  . History of migraine     takes Topamax bid and last migraine a couple of weeks ago  . Cyst of brain   . Weakness   . Numbness and tingling   . Arthritis   . Joint pain   . Joint swelling   . Chronic back pain   . Bruises easily   . GERD (gastroesophageal reflux disease)     takes Dexilant daily  . Nausea     takes Reglan prn  . Constipation   . History of colon polyps   . History of bladder infections     last one about 32yrs ago  . Insomnia     takes Trazodone prn  . Anginal pain     last time 4wks ago-took 2 Nitroglycerin; normal  coronaries '12 cath, non-ischemic stress test 08/2012    Medications:  Prescriptions prior to admission  Medication Sig Dispense Refill  . albuterol (PROVENTIL HFA;VENTOLIN HFA) 108 (90 BASE) MCG/ACT inhaler Inhale 2 puffs into the lungs 2 (two) times daily.        Marland Kitchen ALPRAZolam (XANAX) 0.5 MG tablet Take 0.5 mg by mouth 3 (three) times daily as needed. For anxiety       . budesonide-formoterol (SYMBICORT) 160-4.5 MCG/ACT inhaler Inhale 2 puffs into the lungs 2 (two) times daily.        . cyclobenzaprine (FLEXERIL) 10 MG tablet Take 10 mg by mouth 2 (two) times daily.       Marland Kitchen dexlansoprazole (DEXILANT) 60 MG capsule Take 60 mg by mouth daily.       . DULoxetine (CYMBALTA) 60 MG capsule Take 60 mg by mouth daily.      Marland Kitchen estradiol (ESTRACE) 2 MG tablet Take 2 mg by mouth daily.        Marland Kitchen gabapentin (NEURONTIN) 800 MG tablet Take 800 mg by mouth 4 (four) times daily.        . hydrochlorothiazide (HYDRODIURIL) 25 MG tablet Take 25 mg by mouth daily.        Marland Kitchen lidocaine (LIDODERM) 5 %  Place 1 patch onto the skin daily as needed. Remove & Discard patch within 12 hours or as directed by MD for pain       . lisinopril (PRINIVIL,ZESTRIL) 40 MG tablet Take 40 mg by mouth daily.        . Lorcaserin HCl (BELVIQ) 10 MG TABS Take 10 mg by mouth 2 (two) times daily.      . methadone (DOLOPHINE) 10 MG tablet Take 10 mg by mouth 4 (four) times daily.        . metoCLOPramide (REGLAN) 10 MG tablet Take 10 mg by mouth 2 (two) times daily.        Marland Kitchen oxyCODONE-acetaminophen (PERCOCET) 10-325 MG per tablet Take 1 tablet by mouth every 6 (six) hours as needed for pain.      . simvastatin (ZOCOR) 40 MG tablet Take 40 mg by mouth at bedtime.        Marland Kitchen tiotropium (SPIRIVA) 18 MCG inhalation capsule Place 18 mcg into inhaler and inhale daily.       Marland Kitchen topiramate (TOPAMAX) 100 MG tablet Take 100 mg by mouth 2 (two) times daily.        . Vitamin D, Ergocalciferol, (DRISDOL) 50000 UNITS CAPS Take 50,000 Units by mouth every 7  (seven) days. Takes on mondays       . [DISCONTINUED] celecoxib (CELEBREX) 200 MG capsule Take 200 mg by mouth daily.        . [DISCONTINUED] diclofenac sodium (VOLTAREN) 1 % GEL Apply 1 application topically 2 (two) times daily.        . [DISCONTINUED] naproxen sodium (ANAPROX) 220 MG tablet Take 220 mg by mouth 2 (two) times daily as needed (headache).      . nitroGLYCERIN (NITROSTAT) 0.4 MG SL tablet Place 0.4 mg under the tongue every 5 (five) minutes as needed for chest pain.       Scheduled:  . albuterol  2 puff Inhalation BID  . budesonide-formoterol  2 puff Inhalation BID  . cyclobenzaprine  10 mg Oral BID  . docusate sodium  100 mg Oral BID  . [START ON 05/04/2013] DULoxetine  60 mg Oral Daily  . [START ON 05/04/2013] estradiol  2 mg Oral Daily  . gabapentin  800 mg Oral QID  . [START ON 05/04/2013] hydrochlorothiazide  25 mg Oral Daily  . HYDROmorphone      . HYDROmorphone      . [START ON 05/04/2013] lisinopril  40 mg Oral Daily  . methadone  10 mg Oral QID  . [START ON 05/04/2013] metoCLOPramide  10 mg Oral BID AC  . [START ON 05/04/2013] pantoprazole  40 mg Oral Daily  . simvastatin  40 mg Oral QHS  . sodium chloride  3 mL Intravenous Q12H  . [START ON 05/04/2013] tiotropium  18 mcg Inhalation Daily  . topiramate  100 mg Oral BID  . [START ON 05/09/2013] Vitamin D (Ergocalciferol)  50,000 Units Oral Q Wed    Assessment: 52 yo female s/p cervical decompression/discectomy with a drain in place to begin vancomycin. SCr= 0.63 and CrCl ~ 80-90..   Vancomycin 1000mg  IV given at 1pm today.  Goal of Therapy:  Vancomycin trough level 10-15 mcg/ml  Plan:  -Continue vancomycin 1000mg  IV q12h -Will follow renal function and clinical progress  Harland German, Pharm D 05/03/2013 7:57 PM

## 2013-05-03 NOTE — Anesthesia Preprocedure Evaluation (Signed)
Anesthesia Evaluation  Patient identified by MRN, date of birth, ID band Patient awake    Reviewed: Allergy & Precautions, H&P , NPO status , Patient's Chart, lab work & pertinent test results  Airway Mallampati: II  Neck ROM: full    Dental   Pulmonary shortness of breath, COPDCurrent Smoker,          Cardiovascular hypertension, + angina     Neuro/Psych Depression  Neuromuscular disease    GI/Hepatic GERD-  ,  Endo/Other  Hypothyroidism   Renal/GU      Musculoskeletal  (+) Arthritis -, Fibromyalgia -  Abdominal   Peds  Hematology   Anesthesia Other Findings   Reproductive/Obstetrics                           Anesthesia Physical Anesthesia Plan  ASA: III  Anesthesia Plan: General   Post-op Pain Management:    Induction: Intravenous  Airway Management Planned: Oral ETT  Additional Equipment:   Intra-op Plan:   Post-operative Plan: Extubation in OR  Informed Consent: I have reviewed the patients History and Physical, chart, labs and discussed the procedure including the risks, benefits and alternatives for the proposed anesthesia with the patient or authorized representative who has indicated his/her understanding and acceptance.     Plan Discussed with: CRNA, Anesthesiologist and Surgeon  Anesthesia Plan Comments:         Anesthesia Quick Evaluation

## 2013-05-03 NOTE — Anesthesia Postprocedure Evaluation (Signed)
Anesthesia Post Note  Patient: Savannah Gardner  Procedure(s) Performed: Procedure(s) (LRB): ANTERIOR CERVICAL DECOMPRESSION/DISCECTOMY FUSION 3 LEVELS (N/A)  Anesthesia type: General  Patient location: PACU  Post pain: Pain level controlled and Adequate analgesia  Post assessment: Post-op Vital signs reviewed, Patient's Cardiovascular Status Stable, Respiratory Function Stable, Patent Airway and Pain level controlled  Last Vitals:  Filed Vitals:   05/03/13 1630  BP: 169/79  Pulse: 107  Temp:   Resp: 10    Post vital signs: Reviewed and stable  Level of consciousness: awake, alert  and oriented  Complications: No apparent anesthesia complications

## 2013-05-03 NOTE — Preoperative (Signed)
Beta Blockers   Reason not to administer Beta Blockers:Not Applicable 

## 2013-05-03 NOTE — H&P (Signed)
PREOPERATIVE H&P  Chief Complaint: left arm pain  HPI: Savannah Gardner is a 52 y.o. female who presents with ongoing pain the left arm and deterioration in balance. 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  Past Medical History  Diagnosis Date  . Hyperlipemia     takes Zocor daily  . Fibromyalgia   . COPD (chronic obstructive pulmonary disease)     Albuterol,Symbicort,and Spiriva daily  . Depression     takes Cymbalta daily  . Hypothyroidism     takes Xanax prn  . Hypertension     takes HCTZ and Lisinopril daily  . Shortness of breath     with exertion  . Pneumonia     hx of;last time a yr ago  . History of bronchitis     last time couple of months ago  . History of migraine     takes Topamax bid and last migraine a couple of weeks ago  . Cyst of brain   . Weakness   . Numbness and tingling   . Arthritis   . Joint pain   . Joint swelling   . Chronic back pain   . Bruises easily   . GERD (gastroesophageal reflux disease)     takes Dexilant daily  . Nausea     takes Reglan prn  . Constipation   . History of colon polyps   . History of bladder infections     last one about 3yrs ago  . Insomnia     takes Trazodone prn  . Anginal pain     last time 4wks ago-took 2 Nitroglycerin; normal coronaries '12 cath, non-ischemic stress test 08/2012   Past Surgical History  Procedure Laterality Date  . Tonsillectomy    . Breast surgery    . Cholecystectomy    . Abdominal hysterectomy  1996  . Tubal ligation    . Shoulder surgery      x 3  . Carpal tunnel release    . Cardiac catheterization  2012  . Esophagogastroduodenoscopy    . Colonoscopy     History   Social History  . Marital Status: Married    Spouse Name: N/A    Number of Children: N/A  . Years of Education: N/A   Social History Main Topics  . Smoking status: Current Every Day Smoker -- 2.00 packs/day for 32 years  . Smokeless tobacco: Not on file  . Alcohol Use: No  . Drug Use: No  .  Sexual Activity: Yes    Birth Control/ Protection: Surgical   Other Topics Concern  . Not on file   Social History Narrative  . No narrative on file   Family History  Problem Relation Age of Onset  . Heart failure Mother    Allergies  Allergen Reactions  . Sulfa Antibiotics Nausea And Vomiting  . Penicillins Rash   Prior to Admission medications   Medication Sig Start Date End Date Taking? Authorizing Provider  albuterol (PROVENTIL HFA;VENTOLIN HFA) 108 (90 BASE) MCG/ACT inhaler Inhale 2 puffs into the lungs 2 (two) times daily.     Yes Historical Provider, MD  ALPRAZolam Prudy Feeler) 0.5 MG tablet Take 0.5 mg by mouth 3 (three) times daily as needed. For anxiety    Yes Historical Provider, MD  budesonide-formoterol (SYMBICORT) 160-4.5 MCG/ACT inhaler Inhale 2 puffs into the lungs 2 (two) times daily.     Yes Historical Provider, MD  celecoxib (CELEBREX) 200 MG capsule Take 200 mg by mouth daily.  Yes Historical Provider, MD  cyclobenzaprine (FLEXERIL) 10 MG tablet Take 10 mg by mouth 2 (two) times daily.    Yes Historical Provider, MD  dexlansoprazole (DEXILANT) 60 MG capsule Take 60 mg by mouth daily.    Yes Historical Provider, MD  diclofenac sodium (VOLTAREN) 1 % GEL Apply 1 application topically 2 (two) times daily.     Yes Historical Provider, MD  DULoxetine (CYMBALTA) 60 MG capsule Take 60 mg by mouth daily.   Yes Historical Provider, MD  estradiol (ESTRACE) 2 MG tablet Take 2 mg by mouth daily.     Yes Historical Provider, MD  gabapentin (NEURONTIN) 800 MG tablet Take 800 mg by mouth 4 (four) times daily.     Yes Historical Provider, MD  hydrochlorothiazide (HYDRODIURIL) 25 MG tablet Take 25 mg by mouth daily.     Yes Historical Provider, MD  lidocaine (LIDODERM) 5 % Place 1 patch onto the skin daily as needed. Remove & Discard patch within 12 hours or as directed by MD for pain    Yes Historical Provider, MD  lisinopril (PRINIVIL,ZESTRIL) 40 MG tablet Take 40 mg by mouth daily.      Yes Historical Provider, MD  Lorcaserin HCl (BELVIQ) 10 MG TABS Take 10 mg by mouth 2 (two) times daily.   Yes Historical Provider, MD  methadone (DOLOPHINE) 10 MG tablet Take 10 mg by mouth 4 (four) times daily.     Yes Historical Provider, MD  metoCLOPramide (REGLAN) 10 MG tablet Take 10 mg by mouth 2 (two) times daily.     Yes Historical Provider, MD  naproxen sodium (ANAPROX) 220 MG tablet Take 220 mg by mouth 2 (two) times daily as needed (headache).   Yes Historical Provider, MD  nitroGLYCERIN (NITROSTAT) 0.4 MG SL tablet Place 0.4 mg under the tongue every 5 (five) minutes as needed for chest pain.   Yes Historical Provider, MD  oxyCODONE-acetaminophen (PERCOCET) 10-325 MG per tablet Take 1 tablet by mouth every 6 (six) hours as needed for pain.   Yes Historical Provider, MD  simvastatin (ZOCOR) 40 MG tablet Take 40 mg by mouth at bedtime.     Yes Historical Provider, MD  tiotropium (SPIRIVA) 18 MCG inhalation capsule Place 18 mcg into inhaler and inhale daily.    Yes Historical Provider, MD  topiramate (TOPAMAX) 100 MG tablet Take 100 mg by mouth 2 (two) times daily.     Yes Historical Provider, MD  Vitamin D, Ergocalciferol, (DRISDOL) 50000 UNITS CAPS Take 50,000 Units by mouth every 7 (seven) days. Takes on mondays    Yes Historical Provider, MD     All other systems have been reviewed and were otherwise negative with the exception of those mentioned in the HPI and as above.  Physical Exam: There were no vitals filed for this visit.  General: Alert, no acute distress Cardiovascular: No pedal edema Respiratory: No cyanosis, no use of accessory musculature GI: No organomegaly, abdomen is soft and non-tender Skin: No lesions in the area of chief complaint Neurologic: Sensation intact distally Psychiatric: Patient is competent for consent with normal mood and affect Lymphatic: No axillary or cervical lymphadenopathy  MUSCULOSKELETAL: + hoffman's on  right  Assessment/Plan: Cervical myeloradiculopathy Plan for Procedure(s): ANTERIOR CERVICAL DECOMPRESSION/DISCECTOMY FUSION 3 LEVELS C3-6   Emilee Hero, MD 05/03/2013 7:09 AM

## 2013-05-03 NOTE — Anesthesia Procedure Notes (Signed)
Procedure Name: Intubation Date/Time: 05/03/2013 12:47 PM Performed by: Coralee Rud Pre-anesthesia Checklist: Patient identified, Emergency Drugs available, Suction available and Patient being monitored Patient Re-evaluated:Patient Re-evaluated prior to inductionOxygen Delivery Method: Circle system utilized Preoxygenation: Pre-oxygenation with 100% oxygen Intubation Type: IV induction Ventilation: Mask ventilation without difficulty Laryngoscope size: Elective Glidescope. Grade View: Grade II Tube type: Oral Tube size: 7.5 mm Number of attempts: 1 Airway Equipment and Method: Stylet and Video-laryngoscopy (Elective Glidescope chosen because of patient's neck and shoulder pain.) Placement Confirmation: ETT inserted through vocal cords under direct vision,  positive ETCO2 and breath sounds checked- equal and bilateral Secured at: 22 cm Tube secured with: Tape Dental Injury: Teeth and Oropharynx as per pre-operative assessment  Comments: Elective Glidescope chosen because of patient's symptoms.  It probably was a better choice given her small mouth and overbite.

## 2013-05-04 ENCOUNTER — Encounter (HOSPITAL_COMMUNITY): Payer: Self-pay | Admitting: Orthopedic Surgery

## 2013-05-04 NOTE — Progress Notes (Signed)
Pt 1 Day S/P C3-6 ACDF for Myeloradiculopathy. Reports "unsure yet" if arm pain resolved. Pt appears moderately sedated and answers questions slowly. Of note this is essentially her baseline. She reports expected PO neck px and swallowing difficulties. Has been able to eat soft foods and take down PO liquids. She rested comfortably overnight. She did  BP 149/80  Pulse 88  Temp(Src) 98.9 F (37.2 C) (Oral)  Resp 16  SpO2 97% Pt laying comfortably in hospital bed, moderately sedated (pts baseline) hard collar fitting appropriately tight, bandage C/D/I, neck soft/supple, strength diffusely 4+. SCD's in place.  1 Day PO C3-6 ACDF for myeloradiculopathy  -Pt moderately sedated appearing, this is her baseline  -Pt in [facility] and all prescriptions managed through them   -We increased her percocet to 1 q4 from 1 q6 otherwise medications are home meds   -Pt is aware that [facility] will be managing PO pain meds   -No prescriptions from hospital or our office are to be given  -Hard collar at all times 4 weeks  -[place] collar for showering   -D/C home today pending ambulation and urination

## 2013-05-04 NOTE — Progress Notes (Signed)
UR COMPLETED  

## 2013-05-04 NOTE — Op Note (Signed)
Savannah Gardner, Savannah Gardner NO.:  [id number]  MEDICAL RECORD NO.:  [id number]  LOCATION:  [id number]                        FACILITY:  [facility]  PHYSICIAN:  Estill Bamberg, MD      DATE OF BIRTH:  09-17-1960  DATE OF PROCEDURE:  05/03/2013                              OPERATIVE REPORT   PREOPERATIVE DIAGNOSES: 1. Left-sided cervical radiculopathy. 2. Cervical myelopathy. 3. Degenerative disk disease with varying degrees of neural foraminal     stenosis and spinal cord compression involving C3-4, C4-5, as well     as C5-6.  POSTOPERATIVE DIAGNOSES: 1. Left-sided cervical radiculopathy. 2. Cervical myelopathy. 3. Degenerative disk disease with varying degrees of neural foraminal     stenosis and spinal cord compression involving C3-4, C4-5, as well     as C5-6.  PROCEDURES: 1. Anterior cervical decompression and fusion C3-4, C4-5, C5-6. 2. Placement of anterior instrumentation, C3-C6. 3. Insertion of interbody device x3 (tightened interbody spacers). 4. Use of morselized allograft (DBX mix). 5. Intraoperative use of fluoroscopy.  SURGEON:  Estill Bamberg, MD  ASSISTANT:  Jason Coop, [facility]  ANESTHESIA:  General endotracheal anesthesia.  COMPLICATIONS:  None.  DISPOSITION:  Stable.  ESTIMATED BLOOD LOSS:  Minimal.  INDICATIONS FOR PROCEDURE:  Briefly, Savannah Gardner is a 52 year old female, who did initially presented to me on April 23, 2013, with neck pain as well as left arm pain and deterioration in her balance.  Her balance deterioration had progressed over the course of 6 months.  She did have an MRI, which did reveal spinal cord compression and neural foraminal stenosis, which I did explain the balance instability and pain in her left arm.  Given the natural history of cervical myelopathy, as well as her ongoing pain, which did progress for 6 months despite conservative care, we did discuss proceeding with an anterior cervical decompression and fusion from  C3 through C6.  The patient did fully understand the risks and limitations of the procedure as outlined in my preoperative note.  OPERATIVE DETAILS:  On May 03, 2013; the patient was brought to Surgery and general endotracheal anesthesia was administered.  The patient was placed supine on hospital bed.  The neck was placed in a gentle degree of extension.  Antibiotics were given and a time-out procedure was performed.  The neck was prepped and draped in the usual fashion.  I then made an incision from the midline to the medial border of the sternocleidomastoid muscle.  The platysma was sharply incised. The plane between the sternocleidomastoid muscle and the strap muscles was identified and entered.  The anterior cervical spine was readily noted.  The carotid artery was retracted bilaterally and the esophagus was retracted medially.  I did use fluoroscopy to identify the appropriate operative levels.  The vertebral bodies from C3 through C6 were subperiosteally exposed.  I then placed a self-retaining retractor, centered over the C5-6 interspace.  Caspar pins were placed in the vertebral bodies above and below and distraction was applied.  I then went forward with a thorough and complete diskectomy from the anterior to the posterior aspect of the intervertebral disk.  I was able to confirm a thorough and complete diskectomy and complete decompression of  the neural foramen on the right and left sides as well as the spinal canal.  I then prepared the endplates and placed an appropriate size interbody spacer, PAC with DBX mix.  This was tamped into position in the usual fashion.  I then turned my attention towards the C4-5 interspace.  Caspar pins were then placed above and below C4-5. Distraction was then applied, and I proceeded again with the diskectomy. Again, the neural foramen on the right and left sides were decompressed, as was the spinal canal.  The endplates were prepared and  an appropriate sized interbody spacer was packed with DBX mix and tamped into position in the usual fashion.  I then identified the C3-4 interspace.  As previously described, a diskectomy was performed.  Once again, I did confirm decompression of the neural foramina and the spinal canal.  The endplates were again prepared and an interbody spacer was packed with DBX mix and tamped into position.  I was very pleased with the press fit of each of the implants.  I did use fluoroscopy to help optimize the location of the implants.  I then chose an appropriate sized anterior cervical plate, which was placed over the anterior cervical spine.  Two vertebral body screws were placed in each vertebral body from C3-C6, for a total of 8 vertebral body screws.  I was very pleased with the press fit of each of the screws.  The screws were then locked into the plate using the CAM locking mechanism.  The wound was then copiously irrigated.  I then closed the platysma using 2-0 Vicryl.  The skin was then closed using 3-0 Monocryl.  Of note, a #7-French JP drain was placed deep to the fascia, and collect any blood that were to accumulate.  All instrument counts were correct at the termination of the procedure.  Of note, Jason Coop was my assistant throughout the entirety of the procedure, and did aided in essential retraction and suctioning needed throughout the entirety of the surgery.     Estill Bamberg, MD     MD/MEDQ  D:  05/03/2013  T:  05/04/2013  Job:  [id number]  cc:   Savannah Gardner

## 2013-05-07 NOTE — Discharge Summary (Signed)
Patient ID: Savannah Gardner MRN: [id number] DOB/AGE: May 21, 1961 52 y.o.  Admit date: 05/03/2013 Discharge date: 05/04/2013  Admission Diagnoses: Myeloradiculopathy  Discharge Diagnoses:  Same  Past Medical History  Diagnosis Date  . Hyperlipemia     takes Zocor daily  . Fibromyalgia   . COPD (chronic obstructive pulmonary disease)     Albuterol,Symbicort,and Spiriva daily  . Depression     takes Cymbalta daily  . Hypothyroidism     takes Xanax prn  . Hypertension     takes HCTZ and Lisinopril daily  . Shortness of breath     with exertion  . Pneumonia     hx of;last time a yr ago  . History of bronchitis     last time couple of months ago  . History of migraine     takes Topamax bid and last migraine a couple of weeks ago  . Cyst of brain   . Weakness   . Numbness and tingling   . Arthritis   . Joint pain   . Joint swelling   . Chronic back pain   . Bruises easily   . GERD (gastroesophageal reflux disease)     takes Dexilant daily  . Nausea     takes Reglan prn  . Constipation   . History of colon polyps   . History of bladder infections     last one about 39yrs ago  . Insomnia     takes Trazodone prn  . Anginal pain     last time 4wks ago-took 2 Nitroglycerin; normal coronaries '12 cath, non-ischemic stress test 08/2012    Surgeries: Procedure(s): ANTERIOR CERVICAL DECOMPRESSION/DISCECTOMY FUSION 3 LEVELS C3-6 on 05/03/2013  Discharged Condition: Improved  Hospital Course: Savannah Gardner is an 52 y.o. female who was admitted 05/03/2013 for operative treatment of myeloradiculopathy. Patient has severe unremitting pain that affects sleep, daily activities, and work/hobbies. After pre-op clearance the patient was taken to the operating room on 05/03/2013 and underwent  Procedure(s): ANTERIOR CERVICAL DECOMPRESSION/DISCECTOMY FUSION 3 LEVELS C3-6.    Patient was given perioperative antibiotics:  Anti-infectives   Start     Dose/Rate Route Frequency Ordered Stop    05/04/13 0100  vancomycin (VANCOCIN) IVPB 1000 mg/200 mL premix  Status:  Discontinued     1,000 mg 200 mL/hr over 60 Minutes Intravenous Every 12 hours 05/03/13 1958 05/04/13 1531   05/03/13 0600  vancomycin (VANCOCIN) IVPB 1000 mg/200 mL premix     1,000 mg 200 mL/hr over 60 Minutes Intravenous On call to O.R. 05/02/13 1310 05/03/13 1302       Patient was given sequential compression devices, early ambulation to prevent DVT.  Patient benefited maximally from hospital stay and there were no complications.    Recent vital signs: BP 159/82  Pulse 88  Temp(Src) 98.9 F (37.2 C) (Oral)  Resp 16  SpO2 96%     Discharge Medications:     Medication List    STOP taking these medications       celecoxib 200 MG capsule  Commonly known as:  CELEBREX     diclofenac sodium 1 % Gel  Commonly known as:  VOLTAREN     naproxen sodium 220 MG tablet  Commonly known as:  ANAPROX      TAKE these medications       albuterol 108 (90 BASE) MCG/ACT inhaler  Commonly known as:  PROVENTIL HFA;VENTOLIN HFA  Inhale 2 puffs into the lungs 2 (two) times daily.     ALPRAZolam 0.5  MG tablet  Commonly known as:  XANAX  Take 0.5 mg by mouth 3 (three) times daily as needed. For anxiety     BELVIQ 10 MG Tabs  Generic drug:  Lorcaserin HCl  Take 10 mg by mouth 2 (two) times daily.     budesonide-formoterol 160-4.5 MCG/ACT inhaler  Commonly known as:  SYMBICORT  Inhale 2 puffs into the lungs 2 (two) times daily.     cyclobenzaprine 10 MG tablet  Commonly known as:  FLEXERIL  Take 10 mg by mouth 2 (two) times daily.     DEXILANT 60 MG capsule  Generic drug:  dexlansoprazole  Take 60 mg by mouth daily.     DULoxetine 60 MG capsule  Commonly known as:  CYMBALTA  Take 60 mg by mouth daily.     estradiol 2 MG tablet  Commonly known as:  ESTRACE  Take 2 mg by mouth daily.     gabapentin 800 MG tablet  Commonly known as:  NEURONTIN  Take 800 mg by mouth 4 (four) times daily.      hydrochlorothiazide 25 MG tablet  Commonly known as:  HYDRODIURIL  Take 25 mg by mouth daily.     lidocaine 5 %  Commonly known as:  LIDODERM  Place 1 patch onto the skin daily as needed. Remove & Discard patch within 12 hours or as directed by MD for pain     lisinopril 40 MG tablet  Commonly known as:  PRINIVIL,ZESTRIL  Take 40 mg by mouth daily.     methadone 10 MG tablet  Commonly known as:  DOLOPHINE  Take 10 mg by mouth 4 (four) times daily.     metoCLOPramide 10 MG tablet  Commonly known as:  REGLAN  Take 10 mg by mouth 2 (two) times daily.     nitroGLYCERIN 0.4 MG SL tablet  Commonly known as:  NITROSTAT  Place 0.4 mg under the tongue every 5 (five) minutes as needed for chest pain.     oxyCODONE-acetaminophen 10-325 MG per tablet  Commonly known as:  PERCOCET  Take 1 tablet by mouth every 6 (six) hours as needed for pain.     simvastatin 40 MG tablet  Commonly known as:  ZOCOR  Take 40 mg by mouth at bedtime.     tiotropium 18 MCG inhalation capsule  Commonly known as:  SPIRIVA  Place 18 mcg into inhaler and inhale daily.     topiramate 100 MG tablet  Commonly known as:  TOPAMAX  Take 100 mg by mouth 2 (two) times daily.     Vitamin D (Ergocalciferol) 50000 UNITS Caps capsule  Commonly known as:  DRISDOL  Take 50,000 Units by mouth every 7 (seven) days. Takes on mondays        Diagnostic Studies: Dg Cervical Spine 1 View  05/03/2013   CLINICAL DATA:  Anterior cervical fusion  EXAM: DG CERVICAL SPINE - 1 VIEW; DG C-ARM 1-60 MIN  COMPARISON:  04/18/2013  FINDINGS: Single lateral intraoperative cervical fluoroscopic spot image documents changes of instrumented ACDF C3-C6. Hardware and interbody cages project in expected location. .  IMPRESSION: ACDF C3-C6.   Electronically Signed   By: Oley Balm M.D.   On: 05/03/2013 16:09   Dg C-arm 1-60 Min  05/03/2013   CLINICAL DATA:  Anterior cervical fusion  EXAM: DG CERVICAL SPINE - 1 VIEW; DG C-ARM 1-60 MIN   COMPARISON:  04/18/2013  FINDINGS: Single lateral intraoperative cervical fluoroscopic spot image documents changes of instrumented ACDF  C3-C6. Hardware and interbody cages project in expected location. .  IMPRESSION: ACDF C3-C6.   Electronically Signed   By: Oley Balm M.D.   On: 05/03/2013 16:09    Disposition: 01-Home or Self Care  1 Day PO C3-6 ACDF for myeloradiculopathy  -Pt moderately sedated appearing, this is her baseline  -Pt in [facility] and all prescriptions managed through them  -We increased her percocet to 1 q4 from 1 q6 otherwise medications are home meds  -Pt is aware that [facility] will be managing PO pain meds  -No prescriptions from hospital or our office are to be given  -Hard collar at all times 4 weeks  -[place] collar for showering  -D/C home today pending ambulation and urination   Signed: Georga Bora 05/07/2013, 2:40 PM

## 2013-09-05 ENCOUNTER — Other Ambulatory Visit: Payer: Self-pay | Admitting: Orthopedic Surgery

## 2013-09-05 DIAGNOSIS — M542 Cervicalgia: Secondary | ICD-10-CM

## 2013-09-06 ENCOUNTER — Inpatient Hospital Stay
Admission: RE | Admit: 2013-09-06 | Discharge: 2013-09-06 | Disposition: A | Discharge status: Home / Self Care | Payer: Medicare Other | Source: Ambulatory Visit | Attending: Orthopedic Surgery | Admitting: Orthopedic Surgery

## 2013-09-06 ENCOUNTER — Other Ambulatory Visit: Payer: Medicare Other

## 2013-09-06 NOTE — Discharge Instructions (Signed)
Myelogram Discharge Instructions  1. Go home and rest quietly for the next 24 hours.  It is important to lie flat for the next 24 hours.  Get up only to go to the restroom.  You may lie in the bed or on a couch on your back, your stomach, your left side or your right side.  You may have one pillow under your head.  You may have pillows between your knees while you are on your side or under your knees while you are on your back.  2. DO NOT drive today.  Recline the seat as far back as it will go, while still wearing your seat belt, on the way home.  3. You may get up to go to the bathroom as needed.  You may sit up for 10 minutes to eat.  You may resume your normal diet and medications unless otherwise indicated.  Drink lots of extra fluids today and tomorrow.  4. The incidence of headache, nausea, or vomiting is about 5% (one in 20 patients).  If you develop a headache, lie flat and drink plenty of fluids until the headache goes away.  Caffeinated beverages may be helpful.  If you develop severe nausea and vomiting or a headache that does not go away with flat bed rest, call [phone].  5. You may resume normal activities after your 24 hours of bed rest is over; however, do not exert yourself strongly or do any heavy lifting tomorrow. If when you get up you have a headache when standing, go back to bed and force fluids for another 24 hours.  6. Call your physician for a follow-up appointment.  The results of your myelogram will be sent directly to your physician by the following day.  7. If you have any questions or if complications develop after you arrive home, please call [phone].  Discharge instructions have been explained to the patient.  The patient, or the person responsible for the patient, fully understands these instructions.      May resume cymbalta and reglan on Feb. 6, 2015, after 1:00 pm.

## 2013-09-12 ENCOUNTER — Other Ambulatory Visit: Payer: Self-pay | Admitting: Orthopedic Surgery

## 2013-09-12 ENCOUNTER — Ambulatory Visit
Admission: RE | Admit: 2013-09-12 | Discharge: 2013-09-12 | Disposition: A | Discharge status: Home / Self Care | Payer: Medicare Other | Source: Ambulatory Visit | Attending: Orthopedic Surgery | Admitting: Orthopedic Surgery

## 2013-09-12 ENCOUNTER — Ambulatory Visit
Admission: RE | Admit: 2013-09-12 | Discharge: 2013-09-12 | Disposition: A | Discharge status: Home / Self Care | Payer: Self-pay | Source: Ambulatory Visit | Attending: Orthopedic Surgery | Admitting: Orthopedic Surgery

## 2013-09-12 VITALS — BP 151/84 | HR 78

## 2013-09-12 DIAGNOSIS — R52 Pain, unspecified: Secondary | ICD-10-CM

## 2013-09-12 DIAGNOSIS — M542 Cervicalgia: Secondary | ICD-10-CM

## 2013-09-12 MED ORDER — IOHEXOL 300 MG/ML  SOLN
10.0000 mL | Freq: Once | INTRAMUSCULAR | Status: AC | PRN
  Administered 2013-09-12: 10 mL via INTRATHECAL

## 2013-09-12 MED ORDER — DIAZEPAM 5 MG PO TABS
10.0000 mg | ORAL_TABLET | Freq: Once | ORAL | Status: AC
  Administered 2013-09-12: 10 mg via ORAL

## 2013-09-12 NOTE — Discharge Instructions (Signed)
Myelogram Discharge Instructions  1. Go home and rest quietly for the next 24 hours.  It is important to lie flat for the next 24 hours.  Get up only to go to the restroom.  You may lie in the bed or on a couch on your back, your stomach, your left side or your right side.  You may have one pillow under your head.  You may have pillows between your knees while you are on your side or under your knees while you are on your back.  2. DO NOT drive today.  Recline the seat as far back as it will go, while still wearing your seat belt, on the way home.  3. You may get up to go to the bathroom as needed.  You may sit up for 10 minutes to eat.  You may resume your normal diet and medications unless otherwise indicated.  Drink lots of extra fluids today and tomorrow.  4. The incidence of headache, nausea, or vomiting is about 5% (one in 20 patients).  If you develop a headache, lie flat and drink plenty of fluids until the headache goes away.  Caffeinated beverages may be helpful.  If you develop severe nausea and vomiting or a headache that does not go away with flat bed rest, call [phone].  5. You may resume normal activities after your 24 hours of bed rest is over; however, do not exert yourself strongly or do any heavy lifting tomorrow. If when you get up you have a headache when standing, go back to bed and force fluids for another 24 hours.  6. Call your physician for a follow-up appointment.  The results of your myelogram will be sent directly to your physician by the following day.  7. If you have any questions or if complications develop after you arrive home, please call [phone].  Discharge instructions have been explained to the patient.  The patient, or the person responsible for the patient, fully understands these instructions.      May resume Cymbalta and Reglan on Feb. 12, 2015, after 11:00 am.

## 2013-09-12 NOTE — Progress Notes (Signed)
Patient states she has been off Cymbalta and Reglan for the past two days.  jkl

## 2014-07-11 ENCOUNTER — Encounter (HOSPITAL_COMMUNITY): Payer: Self-pay | Admitting: Cardiovascular Disease

## 2015-02-15 IMAGING — RF DG C-ARM 61-120 MIN
1 series · 1 of 1 positions shown · non-contrast
Comparison: 04/18/2013

CLINICAL DATA: Anterior cervical fusion

EXAM:
DG CERVICAL SPINE - 1 VIEW; DG C-ARM 1-60 MIN

[Series 1: run · 1 of 1 slices shown]
[im 1/1]
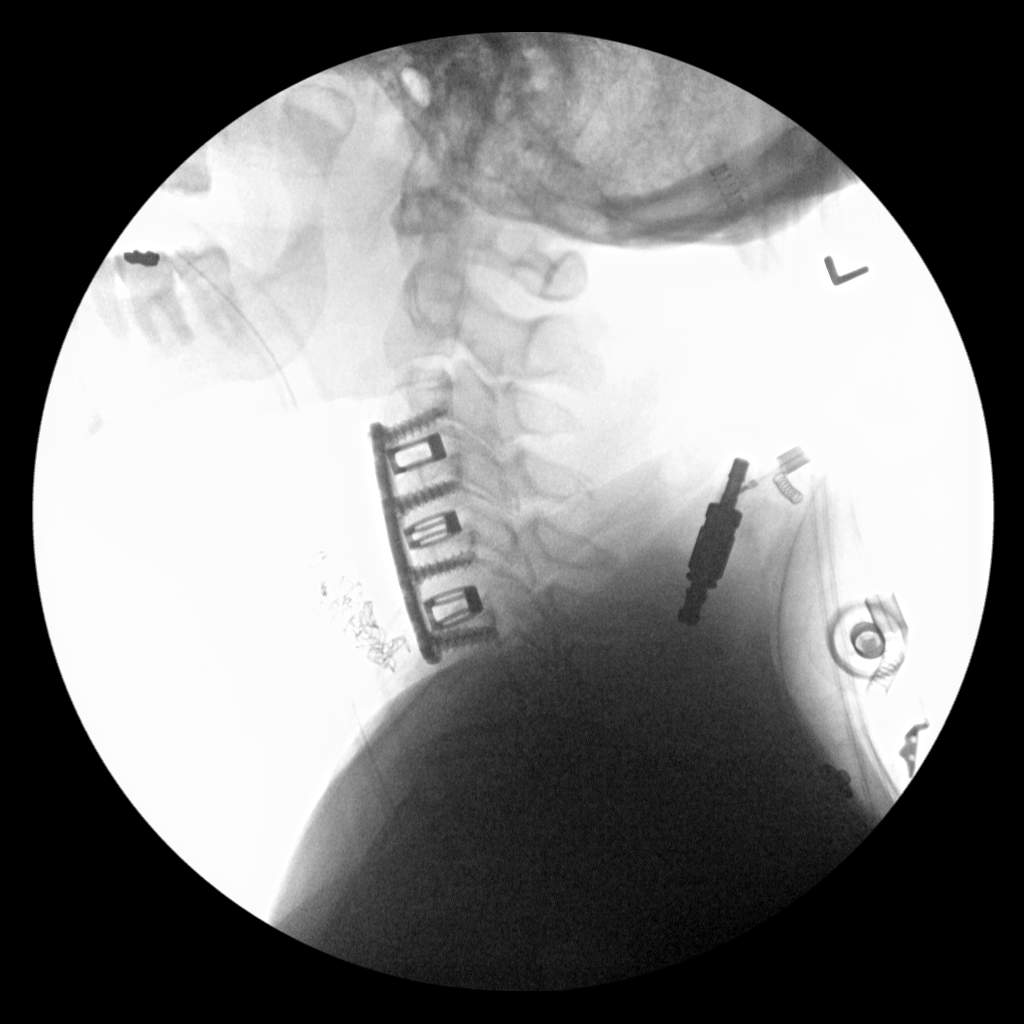

[1 of 1 positions shown; findings below may reference images not displayed]

FINDINGS: Single lateral intraoperative cervical fluoroscopic spot image
documents changes of instrumented ACDF C3-C6. Hardware and interbody
cages project in expected location. .
IMPRESSION: ACDF C3-C6.
